# Patient Record
Sex: Male | Born: 1965
Health system: Southern US, Community
[De-identification: ages and names within clinical notes are randomized; demographics above are authoritative.]

## PROBLEM LIST (undated history)

## (undated) DIAGNOSIS — I10 Essential (primary) hypertension: Secondary | ICD-10-CM

## (undated) DIAGNOSIS — E785 Hyperlipidemia, unspecified: Secondary | ICD-10-CM

## (undated) DIAGNOSIS — I119 Hypertensive heart disease without heart failure: Secondary | ICD-10-CM

## (undated) DIAGNOSIS — I43 Cardiomyopathy in diseases classified elsewhere: Secondary | ICD-10-CM

## (undated) DIAGNOSIS — J309 Allergic rhinitis, unspecified: Secondary | ICD-10-CM

## (undated) DIAGNOSIS — G20A1 Parkinson's disease without dyskinesia, without mention of fluctuations: Secondary | ICD-10-CM

## (undated) HISTORY — DX: Allergic rhinitis, unspecified: J30.9

## (undated) HISTORY — DX: Hypertensive heart disease without heart failure: I11.9

## (undated) HISTORY — DX: Essential (primary) hypertension: I10

## (undated) HISTORY — DX: Hyperlipidemia, unspecified: E78.5

## (undated) HISTORY — DX: Hypertensive heart disease without heart failure: I43

## (undated) HISTORY — PX: BACK SURGERY: SHX140

## (undated) HISTORY — DX: Parkinson's disease without dyskinesia, without mention of fluctuations: G20.A1

---

## 2003-06-20 ENCOUNTER — Inpatient Hospital Stay (HOSPITAL_COMMUNITY): Admission: AD | Admit: 2003-06-20 | Discharge: 2003-06-22 | Payer: Self-pay | Admitting: Neurosurgery

## 2003-06-20 ENCOUNTER — Encounter: Payer: Self-pay | Admitting: Neurosurgery

## 2003-12-23 ENCOUNTER — Emergency Department (HOSPITAL_COMMUNITY): Admission: AD | Admit: 2003-12-23 | Discharge: 2003-12-23 | Payer: Self-pay | Admitting: Family Medicine

## 2003-12-29 ENCOUNTER — Inpatient Hospital Stay (HOSPITAL_COMMUNITY): Admission: AD | Admit: 2003-12-29 | Discharge: 2003-12-30 | Payer: Self-pay | Admitting: Neurosurgery

## 2015-12-28 HISTORY — PX: LIPOMA EXCISION: SHX5283

## 2016-03-16 DIAGNOSIS — D171 Benign lipomatous neoplasm of skin and subcutaneous tissue of trunk: Secondary | ICD-10-CM | POA: Insufficient documentation

## 2016-05-06 DIAGNOSIS — D171 Benign lipomatous neoplasm of skin and subcutaneous tissue of trunk: Secondary | ICD-10-CM | POA: Insufficient documentation

## 2016-05-20 DIAGNOSIS — Z1211 Encounter for screening for malignant neoplasm of colon: Secondary | ICD-10-CM | POA: Insufficient documentation

## 2016-12-17 ENCOUNTER — Other Ambulatory Visit: Payer: Self-pay | Admitting: Nurse Practitioner

## 2016-12-17 ENCOUNTER — Ambulatory Visit
Admission: RE | Admit: 2016-12-17 | Discharge: 2016-12-17 | Disposition: A | Discharge status: Home / Self Care | Payer: Self-pay | Source: Ambulatory Visit | Attending: Nurse Practitioner | Admitting: Nurse Practitioner

## 2016-12-17 DIAGNOSIS — M5126 Other intervertebral disc displacement, lumbar region: Secondary | ICD-10-CM

## 2017-01-17 DIAGNOSIS — M5126 Other intervertebral disc displacement, lumbar region: Secondary | ICD-10-CM | POA: Diagnosis not present

## 2017-01-17 DIAGNOSIS — M47816 Spondylosis without myelopathy or radiculopathy, lumbar region: Secondary | ICD-10-CM | POA: Diagnosis not present

## 2017-01-26 ENCOUNTER — Other Ambulatory Visit: Payer: Self-pay | Admitting: Nurse Practitioner

## 2017-01-26 DIAGNOSIS — M5126 Other intervertebral disc displacement, lumbar region: Secondary | ICD-10-CM

## 2017-01-26 DIAGNOSIS — M47816 Spondylosis without myelopathy or radiculopathy, lumbar region: Secondary | ICD-10-CM

## 2017-02-05 ENCOUNTER — Other Ambulatory Visit: Payer: Self-pay | Admitting: Nurse Practitioner

## 2017-02-05 ENCOUNTER — Ambulatory Visit
Admission: RE | Admit: 2017-02-05 | Discharge: 2017-02-05 | Disposition: A | Discharge status: Home / Self Care | Payer: BLUE CROSS/BLUE SHIELD | Source: Ambulatory Visit | Attending: Nurse Practitioner | Admitting: Nurse Practitioner

## 2017-02-05 DIAGNOSIS — M47816 Spondylosis without myelopathy or radiculopathy, lumbar region: Secondary | ICD-10-CM

## 2017-02-05 DIAGNOSIS — M5126 Other intervertebral disc displacement, lumbar region: Secondary | ICD-10-CM

## 2017-02-13 ENCOUNTER — Ambulatory Visit
Admission: RE | Admit: 2017-02-13 | Discharge: 2017-02-13 | Disposition: A | Discharge status: Home / Self Care | Payer: BLUE CROSS/BLUE SHIELD | Source: Ambulatory Visit | Attending: Nurse Practitioner | Admitting: Nurse Practitioner

## 2017-02-13 DIAGNOSIS — M48061 Spinal stenosis, lumbar region without neurogenic claudication: Secondary | ICD-10-CM | POA: Diagnosis not present

## 2017-02-13 DIAGNOSIS — M5126 Other intervertebral disc displacement, lumbar region: Secondary | ICD-10-CM

## 2017-02-13 DIAGNOSIS — M47816 Spondylosis without myelopathy or radiculopathy, lumbar region: Secondary | ICD-10-CM

## 2017-02-17 DIAGNOSIS — M47816 Spondylosis without myelopathy or radiculopathy, lumbar region: Secondary | ICD-10-CM | POA: Diagnosis not present

## 2017-02-17 DIAGNOSIS — M5126 Other intervertebral disc displacement, lumbar region: Secondary | ICD-10-CM | POA: Diagnosis not present

## 2017-02-18 ENCOUNTER — Other Ambulatory Visit: Payer: Self-pay | Admitting: Nurse Practitioner

## 2017-02-18 DIAGNOSIS — M5126 Other intervertebral disc displacement, lumbar region: Secondary | ICD-10-CM

## 2017-03-22 ENCOUNTER — Ambulatory Visit
Admission: RE | Admit: 2017-03-22 | Discharge: 2017-03-22 | Disposition: A | Discharge status: Home / Self Care | Payer: BLUE CROSS/BLUE SHIELD | Source: Ambulatory Visit | Attending: Nurse Practitioner | Admitting: Nurse Practitioner

## 2017-03-22 DIAGNOSIS — M545 Low back pain: Secondary | ICD-10-CM | POA: Diagnosis not present

## 2017-03-22 DIAGNOSIS — M5126 Other intervertebral disc displacement, lumbar region: Secondary | ICD-10-CM

## 2017-03-22 MED ORDER — IOPAMIDOL (ISOVUE-M 200) INJECTION 41%
1.0000 mL | Freq: Once | INTRAMUSCULAR | Status: AC
  Administered 2017-03-22: 1 mL via EPIDURAL

## 2017-03-22 MED ORDER — METHYLPREDNISOLONE ACETATE 40 MG/ML INJ SUSP (RADIOLOG
120.0000 mg | Freq: Once | INTRAMUSCULAR | Status: AC
  Administered 2017-03-22: 120 mg via EPIDURAL

## 2017-03-22 NOTE — Discharge Instructions (Signed)

## 2017-04-14 DIAGNOSIS — R0781 Pleurodynia: Secondary | ICD-10-CM | POA: Diagnosis not present

## 2017-04-14 DIAGNOSIS — M5126 Other intervertebral disc displacement, lumbar region: Secondary | ICD-10-CM | POA: Diagnosis not present

## 2017-04-15 ENCOUNTER — Other Ambulatory Visit: Payer: Self-pay | Admitting: Nurse Practitioner

## 2017-04-15 DIAGNOSIS — R0781 Pleurodynia: Secondary | ICD-10-CM

## 2017-04-26 ENCOUNTER — Ambulatory Visit
Admission: RE | Admit: 2017-04-26 | Discharge: 2017-04-26 | Disposition: A | Discharge status: Home / Self Care | Payer: BLUE CROSS/BLUE SHIELD | Source: Ambulatory Visit | Attending: Nurse Practitioner | Admitting: Nurse Practitioner

## 2017-04-26 ENCOUNTER — Inpatient Hospital Stay
Admission: RE | Admit: 2017-04-26 | Discharge: 2017-04-26 | Disposition: A | Discharge status: Home / Self Care | Payer: BLUE CROSS/BLUE SHIELD | Source: Ambulatory Visit | Attending: Nurse Practitioner | Admitting: Nurse Practitioner

## 2017-04-26 DIAGNOSIS — R0781 Pleurodynia: Secondary | ICD-10-CM

## 2017-04-26 DIAGNOSIS — M47817 Spondylosis without myelopathy or radiculopathy, lumbosacral region: Secondary | ICD-10-CM | POA: Diagnosis not present

## 2017-04-26 MED ORDER — IOPAMIDOL (ISOVUE-M 200) INJECTION 41%
1.0000 mL | Freq: Once | INTRAMUSCULAR | Status: AC
  Administered 2017-04-26: 1 mL via EPIDURAL

## 2017-04-26 MED ORDER — METHYLPREDNISOLONE ACETATE 40 MG/ML INJ SUSP (RADIOLOG
120.0000 mg | Freq: Once | INTRAMUSCULAR | Status: AC
  Administered 2017-04-26: 120 mg via EPIDURAL

## 2017-04-26 NOTE — Discharge Instructions (Signed)

## 2017-04-29 ENCOUNTER — Other Ambulatory Visit: Payer: Self-pay | Admitting: Gastroenterology

## 2017-04-29 DIAGNOSIS — M545 Low back pain, unspecified: Secondary | ICD-10-CM | POA: Insufficient documentation

## 2017-05-02 DIAGNOSIS — R109 Unspecified abdominal pain: Secondary | ICD-10-CM | POA: Diagnosis not present

## 2017-05-02 DIAGNOSIS — M545 Low back pain: Secondary | ICD-10-CM | POA: Diagnosis not present

## 2017-05-26 DIAGNOSIS — M47816 Spondylosis without myelopathy or radiculopathy, lumbar region: Secondary | ICD-10-CM | POA: Diagnosis not present

## 2017-05-26 DIAGNOSIS — M5126 Other intervertebral disc displacement, lumbar region: Secondary | ICD-10-CM | POA: Diagnosis not present

## 2017-05-27 ENCOUNTER — Other Ambulatory Visit: Payer: Self-pay | Admitting: Nurse Practitioner

## 2017-05-27 DIAGNOSIS — M47816 Spondylosis without myelopathy or radiculopathy, lumbar region: Secondary | ICD-10-CM

## 2017-05-30 ENCOUNTER — Ambulatory Visit
Admission: RE | Admit: 2017-05-30 | Discharge: 2017-05-30 | Disposition: A | Discharge status: Home / Self Care | Payer: BLUE CROSS/BLUE SHIELD | Source: Ambulatory Visit | Attending: Nurse Practitioner | Admitting: Nurse Practitioner

## 2017-05-30 DIAGNOSIS — M47816 Spondylosis without myelopathy or radiculopathy, lumbar region: Secondary | ICD-10-CM

## 2017-05-30 DIAGNOSIS — M47817 Spondylosis without myelopathy or radiculopathy, lumbosacral region: Secondary | ICD-10-CM | POA: Diagnosis not present

## 2017-05-30 MED ORDER — METHYLPREDNISOLONE ACETATE 40 MG/ML INJ SUSP (RADIOLOG
120.0000 mg | Freq: Once | INTRAMUSCULAR | Status: AC
  Administered 2017-05-30: 120 mg via EPIDURAL

## 2017-05-30 MED ORDER — IOPAMIDOL (ISOVUE-M 200) INJECTION 41%
1.0000 mL | Freq: Once | INTRAMUSCULAR | Status: AC
  Administered 2017-05-30: 1 mL via EPIDURAL

## 2017-07-19 IMAGING — MR MR LUMBAR SPINE W/O CM
5 series · 35 of 48 positions shown · non-contrast
Comparison: 05/02/2013

CLINICAL DATA: Lumbar spondylosis.

EXAM:
MRI LUMBAR SPINE WITHOUT CONTRAST
TECHNIQUE: Multiplanar, multisequence MR imaging of the lumbar spine was
performed. No intravenous contrast was administered.

[Series 4: T2 · sagittal · 4.0mm · 0.57mm/px · 4 of 12 slices shown (1 of 2)]
[im 1/12]
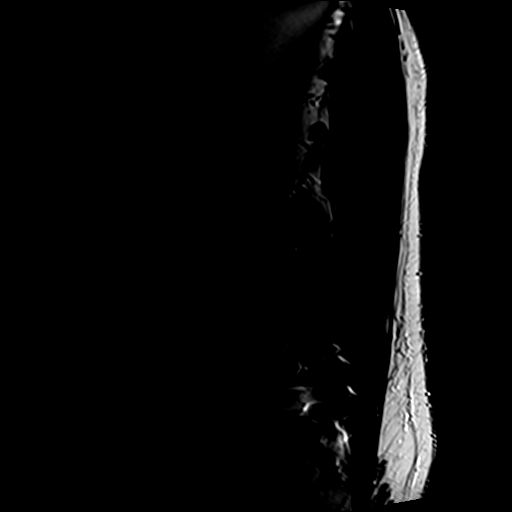
[im 4/12]
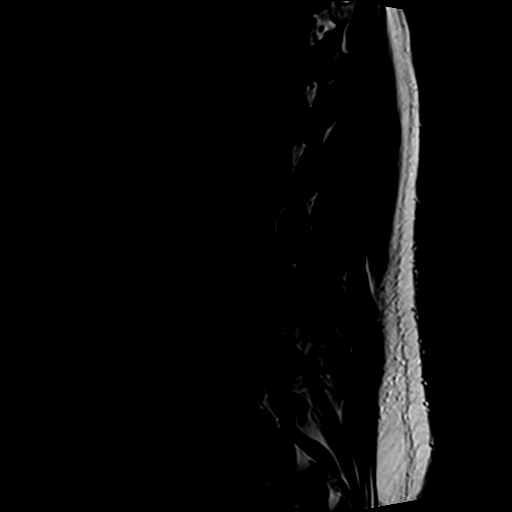
[im 8/12]
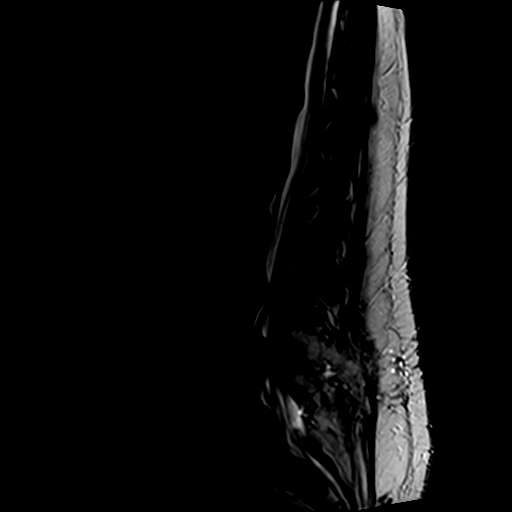
[im 12/12]
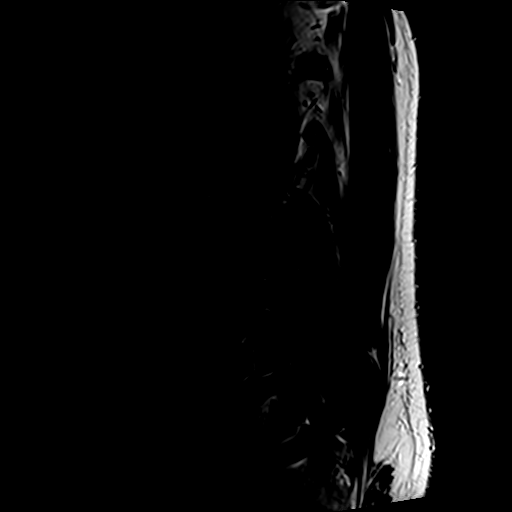

[Series 5: T1 · sagittal · 4.0mm · 0.57mm/px · 5 of 12 slices shown (1 of 2)]
[im 1/12]
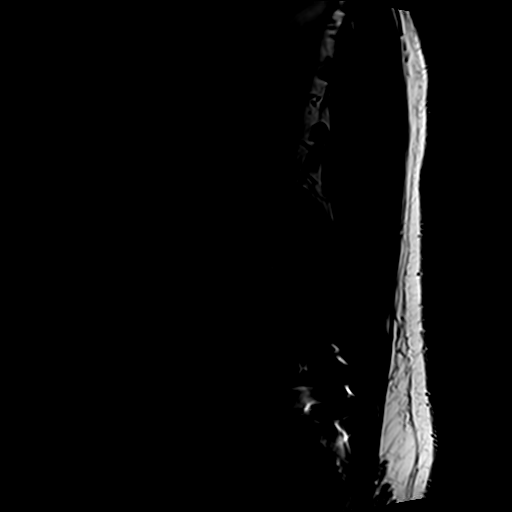
[im 3/12]
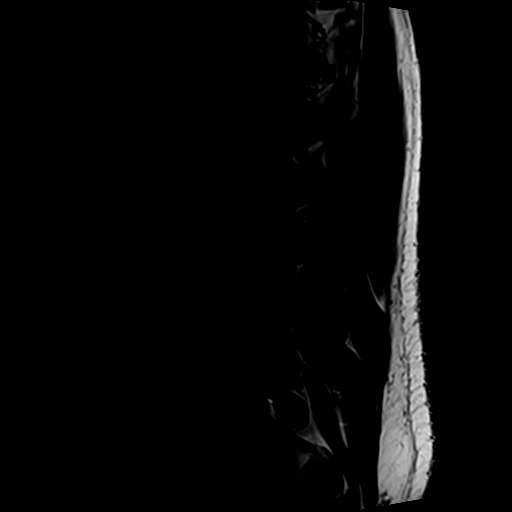
[im 6/12]
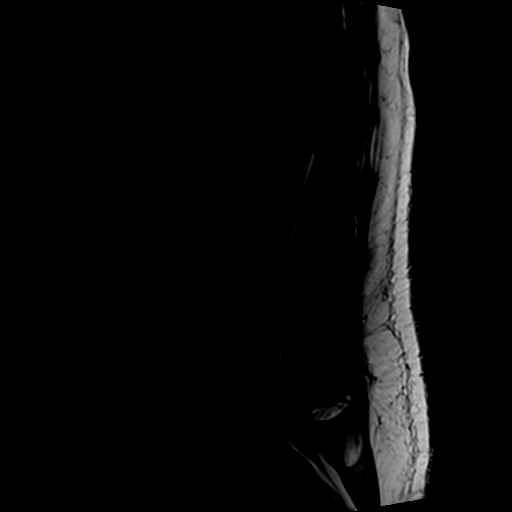
[im 9/12]
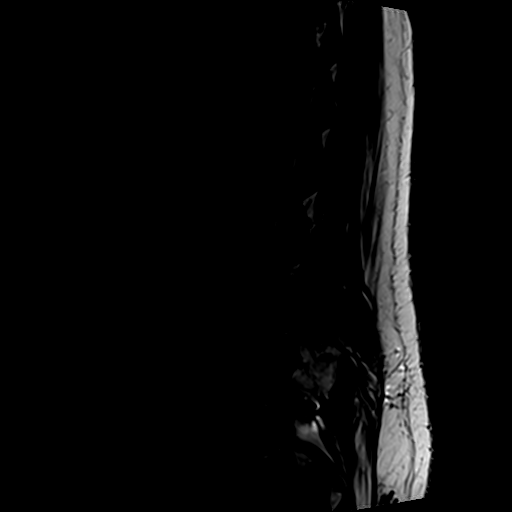
[im 12/12]
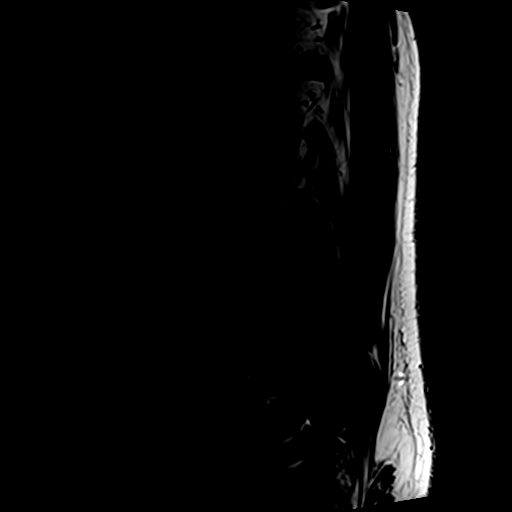

[Series 6: STIR · sagittal · 4.0mm · 0.57mm/px · 4 of 12 slices shown]
[im 1/12]
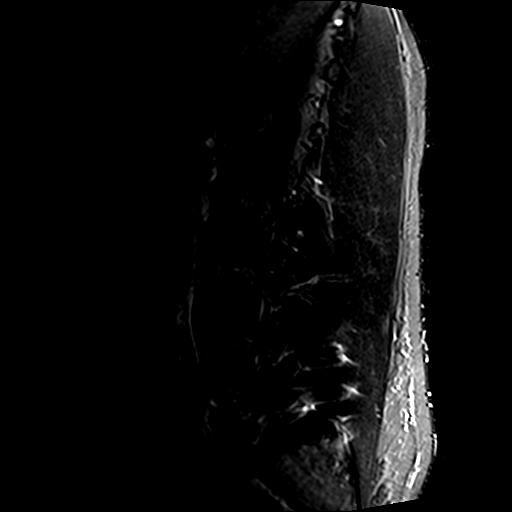
[im 3/12]
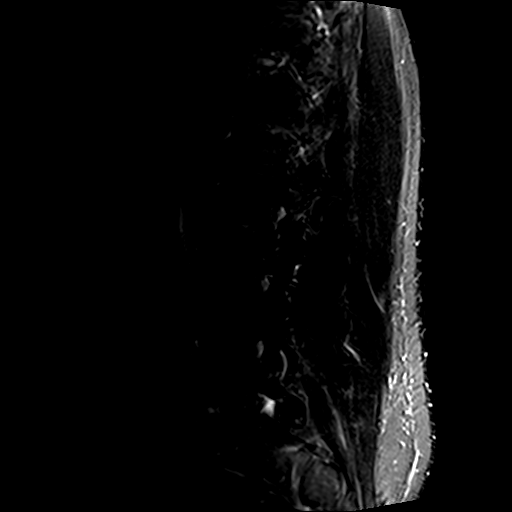
[im 6/12]
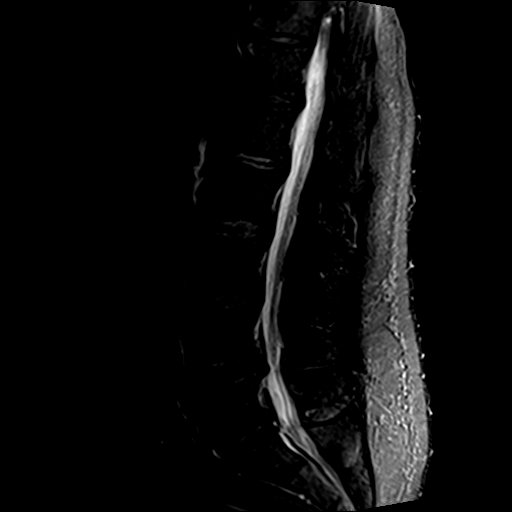
[im 9/12]
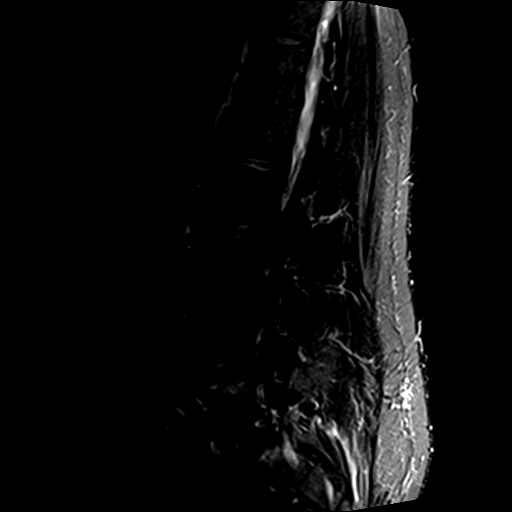

[Series 7: T2 · axial · 4.0mm · 0.74mm/px · z∈[-137,+74]mm · 11 of 42 slices shown (2 of 2)]
[im 3/42]
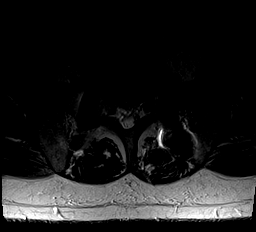
[im 6/42]
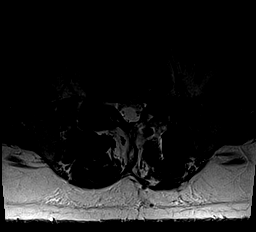
[im 8/42]
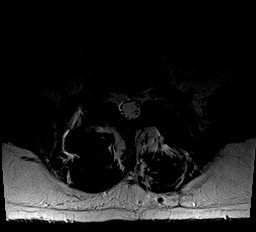
[im 13/42]
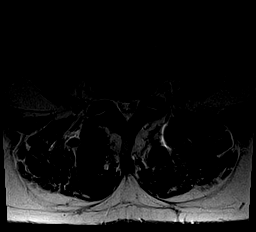
[im 18/42]
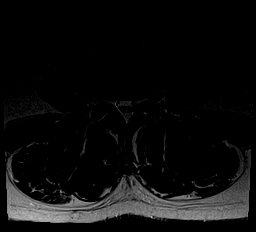
[im 21/42]
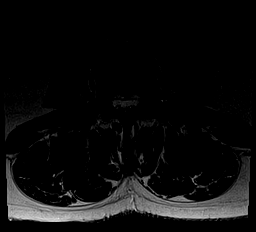
[im 24/42]
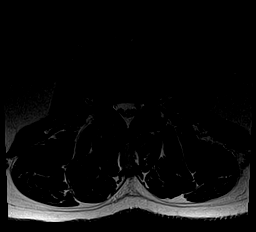
[im 29/42]
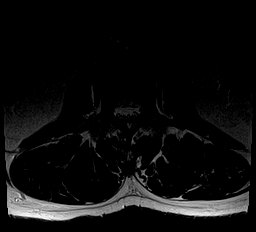
[im 34/42]
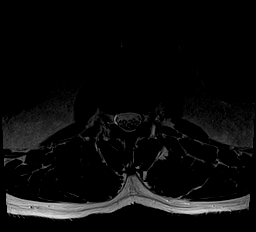
[im 36/42]
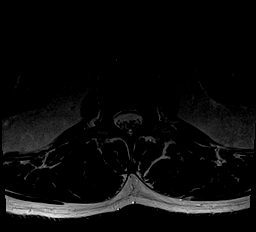
[im 39/42]
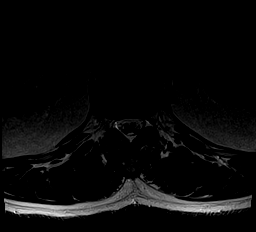

[Series 8: T1 · axial · 4.0mm · 0.74mm/px · z∈[-137,+74]mm · 11 of 41 slices shown (2 of 2)]
[im 3/41]
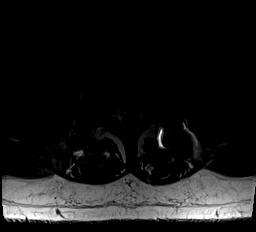
[im 6/41]
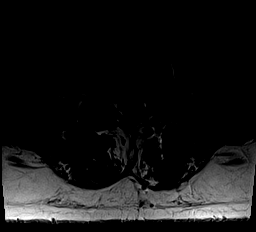
[im 8/41]
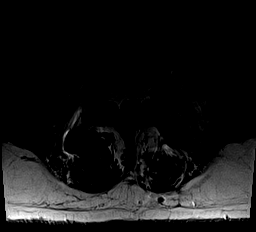
[im 13/41]
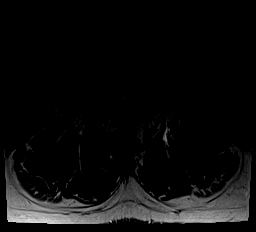
[im 18/41]
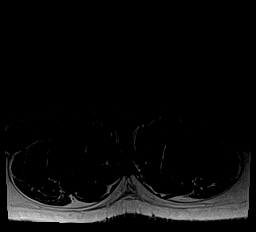
[im 21/41]
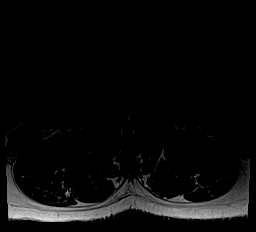
[im 23/41]
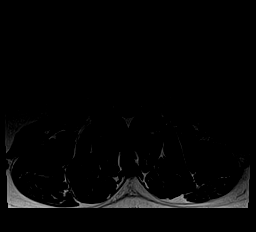
[im 28/41]
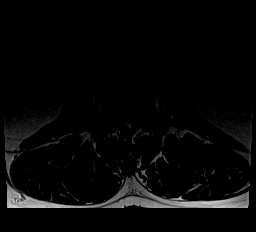
[im 33/41]
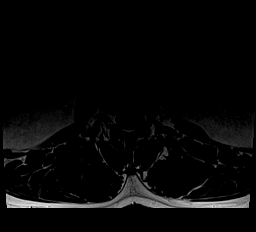
[im 36/41]
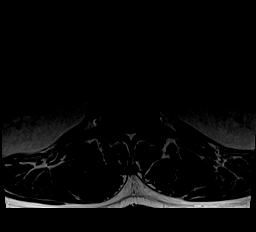
[im 38/41]
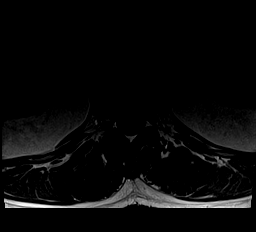

[35 of 48 positions shown; findings below may reference images not displayed]

FINDINGS: Segmentation:  Standard.

Alignment:  Physiologic.

Vertebrae:  No fracture, evidence of discitis, or bone lesion.

Conus medullaris: Extends to the L1 level and appears normal.

Paraspinal and other soft tissues: Negative

Disc levels:

T12- L1: Tiny central disc protrusion with ridge.  No impingement

L1-L2: Unremarkable.

L2-L3: Mild facet spurring.  No herniation or impingement

L3-L4: Mild facet hypertrophy. Mild disc narrowing and minor
bulging. Mild central thecal sac flattening without impingement

L4-L5: Disc narrowing and circumferential bulging with small
superimposed central protrusion. Facet and ligament hypertrophy,
progressed. Progressed spinal stenosis, moderate range. L5 nerves
are posteriorly displaced. Inferior foraminal narrowing without L4
compression

L5-S1:Discectomy with solid bony fusion.  No residual impingement.
IMPRESSION: 1. L4-5 adjacent segment degenerative disease with moderate spinal
stenosis.
2. L5-S1 PLIF with solid bony fusion.

## 2017-08-03 DIAGNOSIS — M5126 Other intervertebral disc displacement, lumbar region: Secondary | ICD-10-CM | POA: Diagnosis not present

## 2017-08-06 DIAGNOSIS — R079 Chest pain, unspecified: Secondary | ICD-10-CM | POA: Diagnosis not present

## 2017-08-06 DIAGNOSIS — M545 Low back pain: Secondary | ICD-10-CM | POA: Diagnosis not present

## 2017-08-06 DIAGNOSIS — R0609 Other forms of dyspnea: Secondary | ICD-10-CM | POA: Diagnosis not present

## 2017-08-06 DIAGNOSIS — G8929 Other chronic pain: Secondary | ICD-10-CM | POA: Diagnosis not present

## 2017-08-06 DIAGNOSIS — R0789 Other chest pain: Secondary | ICD-10-CM | POA: Diagnosis not present

## 2017-08-06 DIAGNOSIS — I517 Cardiomegaly: Secondary | ICD-10-CM | POA: Diagnosis not present

## 2017-08-06 DIAGNOSIS — R0602 Shortness of breath: Secondary | ICD-10-CM | POA: Diagnosis not present

## 2017-08-07 DIAGNOSIS — R0602 Shortness of breath: Secondary | ICD-10-CM | POA: Diagnosis not present

## 2017-08-07 DIAGNOSIS — R079 Chest pain, unspecified: Secondary | ICD-10-CM | POA: Diagnosis not present

## 2017-08-07 DIAGNOSIS — R0609 Other forms of dyspnea: Secondary | ICD-10-CM | POA: Diagnosis not present

## 2017-08-08 DIAGNOSIS — R079 Chest pain, unspecified: Secondary | ICD-10-CM | POA: Diagnosis not present

## 2017-08-08 DIAGNOSIS — R0602 Shortness of breath: Secondary | ICD-10-CM | POA: Diagnosis not present

## 2017-08-10 DIAGNOSIS — I517 Cardiomegaly: Secondary | ICD-10-CM | POA: Diagnosis not present

## 2017-08-10 DIAGNOSIS — R079 Chest pain, unspecified: Secondary | ICD-10-CM | POA: Diagnosis not present

## 2017-08-10 DIAGNOSIS — R400 Somnolence: Secondary | ICD-10-CM | POA: Diagnosis not present

## 2017-08-10 DIAGNOSIS — R0609 Other forms of dyspnea: Secondary | ICD-10-CM | POA: Diagnosis not present

## 2017-09-21 DIAGNOSIS — M545 Low back pain: Secondary | ICD-10-CM | POA: Diagnosis not present

## 2017-09-21 DIAGNOSIS — I10 Essential (primary) hypertension: Secondary | ICD-10-CM | POA: Diagnosis not present

## 2017-09-21 DIAGNOSIS — G8929 Other chronic pain: Secondary | ICD-10-CM | POA: Diagnosis not present

## 2017-11-02 IMAGING — XA Imaging study
1 series · 1 of 1 positions shown · non-contrast
Comparison: none

CLINICAL DATA: Lumbosacral spondylosis without myelopathy. Partial
improvement after the last injection. I repeated it.

[Series 2: ortho standard · 1 of 1 slices shown]
[im 1/1]
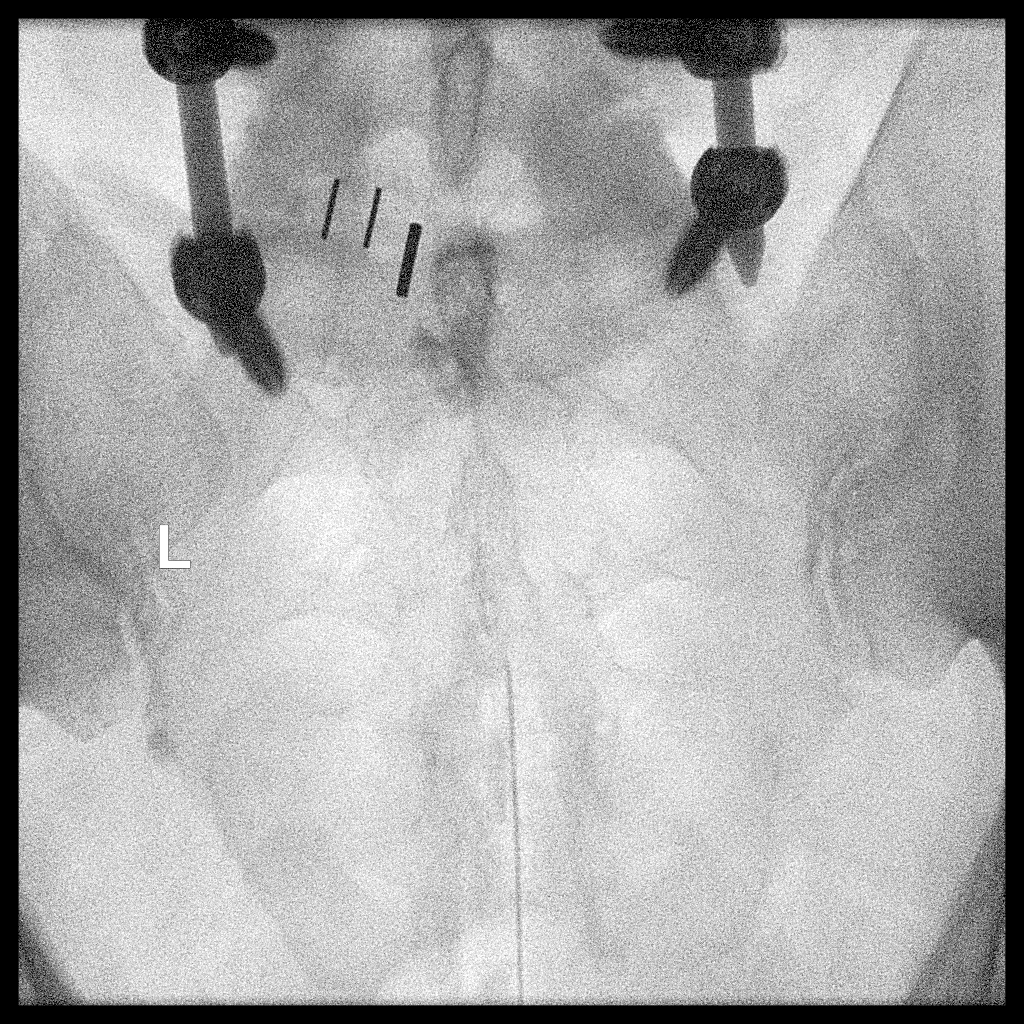

[1 of 1 positions shown; findings below may reference images not displayed]

FLUOROSCOPY TIME:  12 seconds corresponding to a Dose Area Product
of 42.37 ?Gy*m2

EXAM:
CAUDAL EPIDURAL INJECTION

Utilizing a caudal approach, the skin overlying the sacral hiatus
was cleansed and anesthetized. A 20 gauge epidural needle was
advanced into the sacral epidural space. Injection of Isovue-M 200
shows a good epidural pattern with spread up to L5-S1. No vascular
opacification is seen.

120 mg of Depo-Medrol mixed with 3 ml of normal saline and 3 ml of
1% Lidocaine were instilled. The procedure was well-tolerated, and
the patient was discharged thirty minutes following the injection in
good condition.
IMPRESSION: Technically successful caudal epidural injection #3.

## 2017-11-08 DIAGNOSIS — M5126 Other intervertebral disc displacement, lumbar region: Secondary | ICD-10-CM | POA: Diagnosis not present

## 2017-11-08 DIAGNOSIS — Z6834 Body mass index (BMI) 34.0-34.9, adult: Secondary | ICD-10-CM | POA: Diagnosis not present

## 2017-12-27 DIAGNOSIS — I5189 Other ill-defined heart diseases: Secondary | ICD-10-CM

## 2017-12-27 HISTORY — DX: Other ill-defined heart diseases: I51.89

## 2018-02-08 DIAGNOSIS — Z6831 Body mass index (BMI) 31.0-31.9, adult: Secondary | ICD-10-CM | POA: Diagnosis not present

## 2018-02-08 DIAGNOSIS — M5126 Other intervertebral disc displacement, lumbar region: Secondary | ICD-10-CM | POA: Diagnosis not present

## 2018-04-28 DIAGNOSIS — Z6830 Body mass index (BMI) 30.0-30.9, adult: Secondary | ICD-10-CM | POA: Diagnosis not present

## 2018-04-28 DIAGNOSIS — M5126 Other intervertebral disc displacement, lumbar region: Secondary | ICD-10-CM | POA: Diagnosis not present

## 2018-08-04 DIAGNOSIS — M5126 Other intervertebral disc displacement, lumbar region: Secondary | ICD-10-CM | POA: Diagnosis not present

## 2018-08-04 DIAGNOSIS — Z6831 Body mass index (BMI) 31.0-31.9, adult: Secondary | ICD-10-CM | POA: Diagnosis not present

## 2018-08-13 DIAGNOSIS — R55 Syncope and collapse: Secondary | ICD-10-CM | POA: Diagnosis not present

## 2018-08-13 DIAGNOSIS — R61 Generalized hyperhidrosis: Secondary | ICD-10-CM | POA: Diagnosis not present

## 2018-08-13 DIAGNOSIS — E861 Hypovolemia: Secondary | ICD-10-CM | POA: Insufficient documentation

## 2018-08-13 DIAGNOSIS — R9431 Abnormal electrocardiogram [ECG] [EKG]: Secondary | ICD-10-CM | POA: Diagnosis not present

## 2018-08-13 DIAGNOSIS — R001 Bradycardia, unspecified: Secondary | ICD-10-CM | POA: Diagnosis not present

## 2018-08-13 DIAGNOSIS — Z87891 Personal history of nicotine dependence: Secondary | ICD-10-CM | POA: Diagnosis not present

## 2018-08-13 DIAGNOSIS — I9589 Other hypotension: Secondary | ICD-10-CM

## 2018-08-13 DIAGNOSIS — R0902 Hypoxemia: Secondary | ICD-10-CM | POA: Diagnosis not present

## 2018-08-13 DIAGNOSIS — I959 Hypotension, unspecified: Secondary | ICD-10-CM | POA: Diagnosis not present

## 2018-08-14 DIAGNOSIS — R55 Syncope and collapse: Secondary | ICD-10-CM | POA: Diagnosis not present

## 2018-08-14 DIAGNOSIS — I959 Hypotension, unspecified: Secondary | ICD-10-CM | POA: Diagnosis not present

## 2018-08-14 DIAGNOSIS — E861 Hypovolemia: Secondary | ICD-10-CM | POA: Diagnosis not present

## 2018-08-16 DIAGNOSIS — I5030 Unspecified diastolic (congestive) heart failure: Secondary | ICD-10-CM | POA: Diagnosis not present

## 2018-08-16 DIAGNOSIS — R079 Chest pain, unspecified: Secondary | ICD-10-CM | POA: Diagnosis not present

## 2018-08-16 DIAGNOSIS — R55 Syncope and collapse: Secondary | ICD-10-CM | POA: Diagnosis not present

## 2018-08-16 DIAGNOSIS — I10 Essential (primary) hypertension: Secondary | ICD-10-CM | POA: Diagnosis not present

## 2018-08-16 DIAGNOSIS — I517 Cardiomegaly: Secondary | ICD-10-CM | POA: Diagnosis not present

## 2018-08-16 DIAGNOSIS — R0602 Shortness of breath: Secondary | ICD-10-CM | POA: Diagnosis not present

## 2018-08-16 DIAGNOSIS — I252 Old myocardial infarction: Secondary | ICD-10-CM | POA: Diagnosis not present

## 2018-08-16 DIAGNOSIS — R0609 Other forms of dyspnea: Secondary | ICD-10-CM | POA: Diagnosis not present

## 2018-08-16 DIAGNOSIS — Z79899 Other long term (current) drug therapy: Secondary | ICD-10-CM | POA: Diagnosis not present

## 2018-08-16 DIAGNOSIS — R42 Dizziness and giddiness: Secondary | ICD-10-CM | POA: Diagnosis not present

## 2018-08-17 DIAGNOSIS — I5030 Unspecified diastolic (congestive) heart failure: Secondary | ICD-10-CM

## 2018-08-17 DIAGNOSIS — I10 Essential (primary) hypertension: Secondary | ICD-10-CM | POA: Diagnosis not present

## 2018-08-17 DIAGNOSIS — R079 Chest pain, unspecified: Secondary | ICD-10-CM | POA: Diagnosis not present

## 2018-08-17 DIAGNOSIS — R55 Syncope and collapse: Secondary | ICD-10-CM | POA: Diagnosis not present

## 2018-08-17 DIAGNOSIS — I517 Cardiomegaly: Secondary | ICD-10-CM | POA: Diagnosis not present

## 2018-08-18 DIAGNOSIS — R55 Syncope and collapse: Secondary | ICD-10-CM | POA: Diagnosis not present

## 2018-08-18 DIAGNOSIS — I517 Cardiomegaly: Secondary | ICD-10-CM | POA: Diagnosis not present

## 2018-08-18 DIAGNOSIS — I10 Essential (primary) hypertension: Secondary | ICD-10-CM | POA: Diagnosis not present

## 2018-08-18 DIAGNOSIS — R079 Chest pain, unspecified: Secondary | ICD-10-CM | POA: Diagnosis not present

## 2018-08-24 ENCOUNTER — Encounter: Payer: Self-pay | Admitting: Cardiology

## 2018-08-24 ENCOUNTER — Ambulatory Visit: Payer: BLUE CROSS/BLUE SHIELD | Admitting: Cardiology

## 2018-08-24 VITALS — BP 134/86 | HR 70 | Ht 75.0 in | Wt 254.2 lb

## 2018-08-24 DIAGNOSIS — R55 Syncope and collapse: Secondary | ICD-10-CM

## 2018-08-24 NOTE — Progress Notes (Signed)
Cardiology Office Note:    Date:  08/24/2018   ID:  Stuart Lucero, DOB [birth year], MRN [id number]  PCP:  Stuart Lucero., MD  Cardiologist:  Jenean Lindau, MD   Referring MD: Stuart Lucero., MD    ASSESSMENT:    1. Syncope, unspecified syncope type    PLAN:    In order of problems listed above:  1. Primary prevention stressed with the patient.  Importance of compliance with diet and medication stressed and he vocalized understanding. 2. He has been initiated on atorvastatin and his muscle aches at this point asked him to hold his medications. 3. He says that since lisinopril was initiated he feels tired and dizzy at times so have cut down his dose to 5 mg daily.  He tells me that his blood pressure was elevated even before initiating the medication.  He also has pain issues with his lower back which contributes to his blood pressure. 4. He will go one month event monitor to understand if any of these are related to any arrhythmias in his heart. 5. I asked him to get established with a primary care physician as soon as possible and consider neurology evaluation. 6. Patient will be seen in follow-up appointment in 6 months or earlier if the patient has any concerns 7. He knows to go to the nearest emergency room for any concerning symptoms.  In view of his symptoms I told him not to drive or ride till cleared by his primary care doctor.   Medication Adjustments/Labs and Tests Ordered: Current medicines are reviewed at length with the patient today.  Concerns regarding medicines are outlined above.  Orders Placed This Encounter  Procedures  . CARDIAC EVENT MONITOR   No orders of the defined types were placed in this encounter.    History of Present Illness:    Stuart Lucero is a 52 y.o. male who is being seen today for the evaluation of syncope and dyslipidemia at the request of Stuart Lucero., MD.  Patient is a pleasant 51 year old male.  He has past medical history  of dyslipidemia.  He has had 2 episodes of syncope.  Patient mentions to me that he does not feel good he gets in.  Since he has profuse sweating.  He says that after his syncope he had some muscle twitching and such problems.  For this reason he is going to hospitals one in [place], [place] and the other is at a local hospital.  His evaluation was unremarkable.  Echocardiogram was unremarkable and stress test was fine.  He was sent here for evaluation for possible event monitoring.  At the time of my evaluation, the patient is alert awake oriented and in no distress.  He is accompanied by his wife for this visit.  Past Medical History:  Diagnosis Date  . Hypertension     Past Surgical History:  Procedure Laterality Date  . LIPOMA EXCISION  2017    Current Medications: Current Meds  Medication Sig  . HYDROcodone-acetaminophen (NORCO/VICODIN) 5-325 MG tablet Take 1 tablet by mouth at bedtime.   Marland Kitchen lisinopril (PRINIVIL,ZESTRIL) 10 MG tablet Take 10 mg by mouth daily.  . [DISCONTINUED] atorvastatin (LIPITOR) 40 MG tablet Take 40 mg by mouth daily.  . [DISCONTINUED] HYDROcodone-acetaminophen (NORCO/VICODIN) 5-325 MG tablet Take 1 tablet by mouth at bedtime.     Allergies:   Oxycodone-acetaminophen and Gabapentin   Social History   Socioeconomic History  . Marital status: Married    Spouse name:  Not on file  . Number of children: Not on file  . Years of education: Not on file  . Highest education level: Not on file  Occupational History  . Not on file  Social Needs  . Financial resource strain: Not on file  . Food insecurity:    Worry: Not on file    Inability: Not on file  . Transportation needs:    Medical: Not on file    Non-medical: Not on file  Tobacco Use  . Smoking status: Former Smoker    Packs/day: 0.50    Years: 5.00    Pack years: 2.50    Types: Cigarettes    Last attempt to quit: 12/27/2017    Years since quitting: 0.6  . Smokeless tobacco: Never Used    Substance and Sexual Activity  . Alcohol use: Yes    Comment: Occasional beer drinker- 2 beers  . Drug use: Never  . Sexual activity: Not on file  Lifestyle  . Physical activity:    Days per week: Not on file    Minutes per session: Not on file  . Stress: Not on file  Relationships  . Social connections:    Talks on phone: Not on file    Gets together: Not on file    Attends religious service: Not on file    Active member of club or organization: Not on file    Attends meetings of clubs or organizations: Not on file    Relationship status: Not on file  Other Topics Concern  . Not on file  Social History Narrative  . Not on file     Family History: The patient's family history includes Heart disease in his father.  ROS:   Please see the history of present illness.    All other systems reviewed and are negative.  EKGs/Labs/Other Studies Reviewed:    The following studies were reviewed today: I reviewed records from hospitals especially [facility] and discussed this at length with him.   Recent Labs: No results found for requested labs within last 8760 hours.  Recent Lipid Panel No results found for: CHOL, TRIG, HDL, CHOLHDL, VLDL, LDLCALC, LDLDIRECT  Physical Exam:    VS:  BP 134/86 (BP Location: Right Arm, Patient Position: Sitting)   Pulse 70   Ht 6\' 3"  (1.905 m)   Wt 254 lb 3.2 oz (115.3 kg)   SpO2 98%   BMI 31.77 kg/m     Wt Readings from Last 3 Encounters:  08/24/18 254 lb 3.2 oz (115.3 kg)     GEN: Patient is in no acute distress HEENT: Normal NECK: No JVD; No carotid bruits LYMPHATICS: No lymphadenopathy CARDIAC: S1 S2 regular, 2/6 systolic murmur at the apex. RESPIRATORY:  Clear to auscultation without rales, wheezing or rhonchi  ABDOMEN: Soft, non-tender, non-distended MUSCULOSKELETAL:  No edema; No deformity  SKIN: Warm and dry NEUROLOGIC:  Alert and oriented x 3 PSYCHIATRIC:  Normal affect    Signed, Jenean Lindau, MD   08/24/2018 3:01 PM    [facility]

## 2018-08-24 NOTE — Patient Instructions (Addendum)
Medication Instructions:  Your physician has recommended you make the following change in your medication:  STOP lipitor CHANGE 5 mg daily of lisinopril  Labwork: None  Testing/Procedures: Your physician has recommended that you wear an event monitor. Event monitors are medical devices that record the heart's electrical activity. Doctors most often [place] these monitors to diagnose arrhythmias. Arrhythmias are problems with the speed or rhythm of the heartbeat. The monitor is a small, portable device. You can wear one while you do your normal daily activities. This is usually used to diagnose what is causing palpitations/syncope (passing out).  Follow-Up: Your physician recommends that you schedule a follow-up appointment in: 6 months  Any Other Special Instructions Will Be Listed Below (If Applicable).     If you need a refill on your cardiac medications before your next appointment, please call your pharmacy.   [place], RN, BSN

## 2018-09-08 ENCOUNTER — Ambulatory Visit (INDEPENDENT_AMBULATORY_CARE_PROVIDER_SITE_OTHER): Payer: BLUE CROSS/BLUE SHIELD

## 2018-09-08 DIAGNOSIS — R55 Syncope and collapse: Secondary | ICD-10-CM

## 2018-09-21 ENCOUNTER — Ambulatory Visit: Payer: BLUE CROSS/BLUE SHIELD | Admitting: Cardiology

## 2018-09-25 ENCOUNTER — Other Ambulatory Visit: Payer: Self-pay

## 2018-09-25 ENCOUNTER — Telehealth: Payer: Self-pay | Admitting: Cardiology

## 2018-09-25 MED ORDER — LISINOPRIL 10 MG PO TABS: 5.0000 mg | ORAL_TABLET | Freq: Every day | ORAL | 2 refills | Status: DC

## 2018-09-25 NOTE — Telephone Encounter (Signed)
Med refill has been sent. 

## 2018-09-25 NOTE — Telephone Encounter (Signed)
°*  STAT* If patient is at the pharmacy, call can be transferred to refill team.   1. Which medications need to be refilled? (please list name of each medication and dose if known)Lisinopril 10mg  takes 1 daily   2. Which pharmacy/location (including street and city if local pharmacy) is medication to be sent to [facility] in [place]  3. Do they need a 30 day or 90 day supply? [place]

## 2018-10-03 DIAGNOSIS — I10 Essential (primary) hypertension: Secondary | ICD-10-CM | POA: Diagnosis not present

## 2018-10-03 DIAGNOSIS — E785 Hyperlipidemia, unspecified: Secondary | ICD-10-CM | POA: Diagnosis not present

## 2018-10-03 DIAGNOSIS — I119 Hypertensive heart disease without heart failure: Secondary | ICD-10-CM | POA: Diagnosis not present

## 2018-10-03 DIAGNOSIS — I43 Cardiomyopathy in diseases classified elsewhere: Secondary | ICD-10-CM | POA: Diagnosis not present

## 2018-10-19 DIAGNOSIS — I10 Essential (primary) hypertension: Secondary | ICD-10-CM | POA: Diagnosis not present

## 2018-10-19 DIAGNOSIS — I43 Cardiomyopathy in diseases classified elsewhere: Secondary | ICD-10-CM | POA: Diagnosis not present

## 2018-10-19 DIAGNOSIS — I119 Hypertensive heart disease without heart failure: Secondary | ICD-10-CM | POA: Diagnosis not present

## 2018-10-19 DIAGNOSIS — Z6832 Body mass index (BMI) 32.0-32.9, adult: Secondary | ICD-10-CM | POA: Diagnosis not present

## 2018-11-13 DIAGNOSIS — H6691 Otitis media, unspecified, right ear: Secondary | ICD-10-CM | POA: Diagnosis not present

## 2018-11-17 DIAGNOSIS — Z6833 Body mass index (BMI) 33.0-33.9, adult: Secondary | ICD-10-CM | POA: Diagnosis not present

## 2018-11-17 DIAGNOSIS — M5126 Other intervertebral disc displacement, lumbar region: Secondary | ICD-10-CM | POA: Diagnosis not present

## 2018-12-14 DIAGNOSIS — Z6833 Body mass index (BMI) 33.0-33.9, adult: Secondary | ICD-10-CM | POA: Diagnosis not present

## 2018-12-14 DIAGNOSIS — Z0000 Encounter for general adult medical examination without abnormal findings: Secondary | ICD-10-CM | POA: Diagnosis not present

## 2018-12-14 DIAGNOSIS — E669 Obesity, unspecified: Secondary | ICD-10-CM | POA: Diagnosis not present

## 2019-01-04 DIAGNOSIS — R55 Syncope and collapse: Secondary | ICD-10-CM | POA: Diagnosis not present

## 2019-03-12 DIAGNOSIS — M5126 Other intervertebral disc displacement, lumbar region: Secondary | ICD-10-CM | POA: Diagnosis not present

## 2019-05-24 DIAGNOSIS — R05 Cough: Secondary | ICD-10-CM | POA: Diagnosis not present

## 2019-05-24 DIAGNOSIS — Z20828 Contact with and (suspected) exposure to other viral communicable diseases: Secondary | ICD-10-CM | POA: Diagnosis not present

## 2019-07-02 DIAGNOSIS — G894 Chronic pain syndrome: Secondary | ICD-10-CM | POA: Diagnosis not present

## 2019-07-02 DIAGNOSIS — M545 Low back pain: Secondary | ICD-10-CM | POA: Diagnosis not present

## 2019-07-12 DIAGNOSIS — M545 Low back pain: Secondary | ICD-10-CM | POA: Diagnosis not present

## 2019-08-02 DIAGNOSIS — M47816 Spondylosis without myelopathy or radiculopathy, lumbar region: Secondary | ICD-10-CM | POA: Diagnosis not present

## 2019-08-02 DIAGNOSIS — Z1389 Encounter for screening for other disorder: Secondary | ICD-10-CM | POA: Diagnosis not present

## 2019-08-02 DIAGNOSIS — G894 Chronic pain syndrome: Secondary | ICD-10-CM | POA: Diagnosis not present

## 2019-08-02 DIAGNOSIS — M5416 Radiculopathy, lumbar region: Secondary | ICD-10-CM | POA: Diagnosis not present

## 2019-09-17 DIAGNOSIS — M47816 Spondylosis without myelopathy or radiculopathy, lumbar region: Secondary | ICD-10-CM | POA: Diagnosis not present

## 2019-09-17 DIAGNOSIS — M5416 Radiculopathy, lumbar region: Secondary | ICD-10-CM | POA: Diagnosis not present

## 2019-09-17 DIAGNOSIS — G894 Chronic pain syndrome: Secondary | ICD-10-CM | POA: Diagnosis not present

## 2019-11-09 DIAGNOSIS — Z6833 Body mass index (BMI) 33.0-33.9, adult: Secondary | ICD-10-CM | POA: Diagnosis not present

## 2019-11-09 DIAGNOSIS — J40 Bronchitis, not specified as acute or chronic: Secondary | ICD-10-CM | POA: Diagnosis not present

## 2019-11-09 DIAGNOSIS — J329 Chronic sinusitis, unspecified: Secondary | ICD-10-CM | POA: Diagnosis not present

## 2019-12-11 DIAGNOSIS — R0982 Postnasal drip: Secondary | ICD-10-CM | POA: Diagnosis not present

## 2019-12-11 DIAGNOSIS — R0789 Other chest pain: Secondary | ICD-10-CM | POA: Diagnosis not present

## 2019-12-11 DIAGNOSIS — J209 Acute bronchitis, unspecified: Secondary | ICD-10-CM | POA: Diagnosis not present

## 2019-12-11 DIAGNOSIS — J309 Allergic rhinitis, unspecified: Secondary | ICD-10-CM | POA: Diagnosis not present

## 2020-01-02 DIAGNOSIS — J309 Allergic rhinitis, unspecified: Secondary | ICD-10-CM | POA: Diagnosis not present

## 2020-01-02 DIAGNOSIS — R05 Cough: Secondary | ICD-10-CM | POA: Diagnosis not present

## 2020-01-02 DIAGNOSIS — J45909 Unspecified asthma, uncomplicated: Secondary | ICD-10-CM | POA: Diagnosis not present

## 2020-01-02 DIAGNOSIS — K219 Gastro-esophageal reflux disease without esophagitis: Secondary | ICD-10-CM | POA: Diagnosis not present

## 2020-03-11 DIAGNOSIS — M47816 Spondylosis without myelopathy or radiculopathy, lumbar region: Secondary | ICD-10-CM | POA: Diagnosis not present

## 2020-03-11 DIAGNOSIS — M545 Low back pain: Secondary | ICD-10-CM | POA: Diagnosis not present

## 2020-03-11 DIAGNOSIS — Z6832 Body mass index (BMI) 32.0-32.9, adult: Secondary | ICD-10-CM | POA: Diagnosis not present

## 2020-03-11 DIAGNOSIS — M62830 Muscle spasm of back: Secondary | ICD-10-CM | POA: Diagnosis not present

## 2020-06-11 DIAGNOSIS — Z20828 Contact with and (suspected) exposure to other viral communicable diseases: Secondary | ICD-10-CM | POA: Diagnosis not present

## 2020-06-11 DIAGNOSIS — R509 Fever, unspecified: Secondary | ICD-10-CM | POA: Diagnosis not present

## 2020-06-16 DIAGNOSIS — Z87891 Personal history of nicotine dependence: Secondary | ICD-10-CM | POA: Diagnosis not present

## 2020-06-16 DIAGNOSIS — I252 Old myocardial infarction: Secondary | ICD-10-CM | POA: Diagnosis not present

## 2020-06-16 DIAGNOSIS — R55 Syncope and collapse: Secondary | ICD-10-CM | POA: Diagnosis not present

## 2020-06-16 DIAGNOSIS — R42 Dizziness and giddiness: Secondary | ICD-10-CM | POA: Diagnosis not present

## 2020-06-16 DIAGNOSIS — E860 Dehydration: Secondary | ICD-10-CM | POA: Diagnosis not present

## 2020-06-16 DIAGNOSIS — R5383 Other fatigue: Secondary | ICD-10-CM | POA: Diagnosis not present

## 2020-06-16 DIAGNOSIS — I10 Essential (primary) hypertension: Secondary | ICD-10-CM | POA: Diagnosis not present

## 2020-06-16 DIAGNOSIS — U071 COVID-19: Secondary | ICD-10-CM | POA: Diagnosis not present

## 2020-10-30 DIAGNOSIS — Z13220 Encounter for screening for lipoid disorders: Secondary | ICD-10-CM | POA: Diagnosis not present

## 2020-10-30 DIAGNOSIS — Z113 Encounter for screening for infections with a predominantly sexual mode of transmission: Secondary | ICD-10-CM | POA: Diagnosis not present

## 2020-10-30 DIAGNOSIS — Z131 Encounter for screening for diabetes mellitus: Secondary | ICD-10-CM | POA: Diagnosis not present

## 2020-10-30 DIAGNOSIS — Z0000 Encounter for general adult medical examination without abnormal findings: Secondary | ICD-10-CM | POA: Diagnosis not present

## 2020-10-30 DIAGNOSIS — Z125 Encounter for screening for malignant neoplasm of prostate: Secondary | ICD-10-CM | POA: Diagnosis not present

## 2020-12-02 DIAGNOSIS — Z20828 Contact with and (suspected) exposure to other viral communicable diseases: Secondary | ICD-10-CM | POA: Diagnosis not present

## 2020-12-02 DIAGNOSIS — J329 Chronic sinusitis, unspecified: Secondary | ICD-10-CM | POA: Diagnosis not present

## 2020-12-02 DIAGNOSIS — J40 Bronchitis, not specified as acute or chronic: Secondary | ICD-10-CM | POA: Diagnosis not present

## 2020-12-05 DIAGNOSIS — J101 Influenza due to other identified influenza virus with other respiratory manifestations: Secondary | ICD-10-CM | POA: Diagnosis not present

## 2020-12-05 DIAGNOSIS — J329 Chronic sinusitis, unspecified: Secondary | ICD-10-CM | POA: Diagnosis not present

## 2020-12-05 DIAGNOSIS — J40 Bronchitis, not specified as acute or chronic: Secondary | ICD-10-CM | POA: Diagnosis not present

## 2021-01-28 DIAGNOSIS — I119 Hypertensive heart disease without heart failure: Secondary | ICD-10-CM | POA: Diagnosis not present

## 2021-01-28 DIAGNOSIS — I10 Essential (primary) hypertension: Secondary | ICD-10-CM | POA: Diagnosis not present

## 2021-01-28 DIAGNOSIS — I43 Cardiomyopathy in diseases classified elsewhere: Secondary | ICD-10-CM | POA: Diagnosis not present

## 2021-01-28 DIAGNOSIS — E669 Obesity, unspecified: Secondary | ICD-10-CM | POA: Diagnosis not present

## 2021-02-25 DIAGNOSIS — R0989 Other specified symptoms and signs involving the circulatory and respiratory systems: Secondary | ICD-10-CM | POA: Diagnosis not present

## 2021-02-25 DIAGNOSIS — I43 Cardiomyopathy in diseases classified elsewhere: Secondary | ICD-10-CM | POA: Diagnosis not present

## 2021-02-25 DIAGNOSIS — I119 Hypertensive heart disease without heart failure: Secondary | ICD-10-CM | POA: Diagnosis not present

## 2021-02-25 DIAGNOSIS — I10 Essential (primary) hypertension: Secondary | ICD-10-CM | POA: Diagnosis not present

## 2021-04-02 DIAGNOSIS — R0989 Other specified symptoms and signs involving the circulatory and respiratory systems: Secondary | ICD-10-CM | POA: Diagnosis not present

## 2021-04-02 DIAGNOSIS — D487 Neoplasm of uncertain behavior of other specified sites: Secondary | ICD-10-CM | POA: Diagnosis not present

## 2021-04-02 DIAGNOSIS — D179 Benign lipomatous neoplasm, unspecified: Secondary | ICD-10-CM | POA: Diagnosis not present

## 2021-04-02 DIAGNOSIS — I10 Essential (primary) hypertension: Secondary | ICD-10-CM | POA: Diagnosis not present

## 2021-05-07 DIAGNOSIS — L719 Rosacea, unspecified: Secondary | ICD-10-CM | POA: Diagnosis not present

## 2021-05-07 DIAGNOSIS — L820 Inflamed seborrheic keratosis: Secondary | ICD-10-CM | POA: Diagnosis not present

## 2021-06-19 DIAGNOSIS — R3989 Other symptoms and signs involving the genitourinary system: Secondary | ICD-10-CM | POA: Diagnosis not present

## 2021-06-19 DIAGNOSIS — Z6832 Body mass index (BMI) 32.0-32.9, adult: Secondary | ICD-10-CM | POA: Diagnosis not present

## 2021-06-23 DIAGNOSIS — N3289 Other specified disorders of bladder: Secondary | ICD-10-CM | POA: Diagnosis not present

## 2021-06-23 DIAGNOSIS — N3943 Post-void dribbling: Secondary | ICD-10-CM | POA: Diagnosis not present

## 2021-06-23 DIAGNOSIS — R3989 Other symptoms and signs involving the genitourinary system: Secondary | ICD-10-CM | POA: Diagnosis not present

## 2021-08-03 DIAGNOSIS — Z1212 Encounter for screening for malignant neoplasm of rectum: Secondary | ICD-10-CM | POA: Diagnosis not present

## 2021-08-03 DIAGNOSIS — Z1211 Encounter for screening for malignant neoplasm of colon: Secondary | ICD-10-CM | POA: Diagnosis not present

## 2021-11-05 DIAGNOSIS — Z20828 Contact with and (suspected) exposure to other viral communicable diseases: Secondary | ICD-10-CM | POA: Diagnosis not present

## 2021-11-05 DIAGNOSIS — J069 Acute upper respiratory infection, unspecified: Secondary | ICD-10-CM | POA: Diagnosis not present

## 2021-12-10 DIAGNOSIS — Z20828 Contact with and (suspected) exposure to other viral communicable diseases: Secondary | ICD-10-CM | POA: Diagnosis not present

## 2021-12-10 DIAGNOSIS — M791 Myalgia, unspecified site: Secondary | ICD-10-CM | POA: Diagnosis not present

## 2021-12-10 DIAGNOSIS — Z6833 Body mass index (BMI) 33.0-33.9, adult: Secondary | ICD-10-CM | POA: Diagnosis not present

## 2022-01-21 DIAGNOSIS — E669 Obesity, unspecified: Secondary | ICD-10-CM | POA: Diagnosis not present

## 2022-01-21 DIAGNOSIS — Z131 Encounter for screening for diabetes mellitus: Secondary | ICD-10-CM | POA: Diagnosis not present

## 2022-01-21 DIAGNOSIS — Z0000 Encounter for general adult medical examination without abnormal findings: Secondary | ICD-10-CM | POA: Diagnosis not present

## 2022-01-21 DIAGNOSIS — R300 Dysuria: Secondary | ICD-10-CM | POA: Diagnosis not present

## 2022-01-21 DIAGNOSIS — E785 Hyperlipidemia, unspecified: Secondary | ICD-10-CM | POA: Diagnosis not present

## 2022-01-21 DIAGNOSIS — Z125 Encounter for screening for malignant neoplasm of prostate: Secondary | ICD-10-CM | POA: Diagnosis not present

## 2022-01-29 DIAGNOSIS — R350 Frequency of micturition: Secondary | ICD-10-CM | POA: Diagnosis not present

## 2022-01-29 DIAGNOSIS — R351 Nocturia: Secondary | ICD-10-CM | POA: Diagnosis not present

## 2022-01-29 DIAGNOSIS — N401 Enlarged prostate with lower urinary tract symptoms: Secondary | ICD-10-CM | POA: Diagnosis not present

## 2022-03-10 DIAGNOSIS — R351 Nocturia: Secondary | ICD-10-CM | POA: Diagnosis not present

## 2022-03-10 DIAGNOSIS — N401 Enlarged prostate with lower urinary tract symptoms: Secondary | ICD-10-CM | POA: Diagnosis not present

## 2022-03-10 DIAGNOSIS — R350 Frequency of micturition: Secondary | ICD-10-CM | POA: Diagnosis not present

## 2022-04-29 DIAGNOSIS — E669 Obesity, unspecified: Secondary | ICD-10-CM | POA: Diagnosis not present

## 2022-04-29 DIAGNOSIS — I10 Essential (primary) hypertension: Secondary | ICD-10-CM | POA: Diagnosis not present

## 2022-04-29 DIAGNOSIS — Z6833 Body mass index (BMI) 33.0-33.9, adult: Secondary | ICD-10-CM | POA: Diagnosis not present

## 2022-04-29 DIAGNOSIS — R251 Tremor, unspecified: Secondary | ICD-10-CM | POA: Diagnosis not present

## 2022-05-26 ENCOUNTER — Encounter: Payer: Self-pay | Admitting: *Deleted

## 2022-05-27 ENCOUNTER — Ambulatory Visit: Payer: BC Managed Care – PPO | Admitting: Neurology

## 2022-05-27 ENCOUNTER — Encounter: Payer: Self-pay | Admitting: Neurology

## 2022-05-27 VITALS — BP 145/84 | HR 60 | Ht 75.0 in | Wt 261.2 lb

## 2022-05-27 DIAGNOSIS — G20 Parkinson's disease: Secondary | ICD-10-CM | POA: Diagnosis not present

## 2022-05-27 DIAGNOSIS — F419 Anxiety disorder, unspecified: Secondary | ICD-10-CM | POA: Diagnosis not present

## 2022-05-27 DIAGNOSIS — F320 Major depressive disorder, single episode, mild: Secondary | ICD-10-CM | POA: Diagnosis not present

## 2022-05-27 DIAGNOSIS — G479 Sleep disorder, unspecified: Secondary | ICD-10-CM | POA: Diagnosis not present

## 2022-05-27 MED ORDER — ROPINIROLE HCL 0.25 MG PO TABS: 0.7500 mg | ORAL_TABLET | Freq: Three times a day (TID) | ORAL | 3 refills | Status: DC

## 2022-05-27 NOTE — Progress Notes (Signed)
Subjective:    Patient ID: Stuart Lucero is a 56 y.o. male.  HPI    Star Age, MD, PhD [facility] [address], [address] [place], [place] [address]  Dear Dr. Venetia Maxon,  I saw your patient, Stuart Lucero, for your kind request to clinic today for initial consultation of his tremor.  The patient is unaccompanied today.  As you know, Stuart Lucero is a 56 year old right-handed gentleman with an underlying medical history of cardiomyopathy, allergic rhinitis, anxiety, hypertension, hyperlipidemia, and mild obesity, who reports an approximately 6 to 18-monthhistory of trembling in the left upper extremity.  He has a history of COVID twice.  First time he had it in 2021 and second time in December 2022.  He reports quite a bit of stress including marital stress.  He eventually got divorced after about 28 years.  He got divorced in 2021 and now lives alone, has 1 dog in the household, has 3 children from his last marriage, 2 sons, ages 260and 227and 1 daughter, age 57  He also has a son from his previous marriage, age 57  In the past few years he has had increase in anxiety and also depressive symptoms.  He is currently not on any antidepressant.  He reports that his grades are starting to function.  He is in the form of coffee, 1 or 2 cups/day and drinks alcohol nearly daily, approximately 5 times a week, 2-3 drinks at a time.  He quit smoking in 2020.  He has no family history of tremors or Parkinson's disease.  His father lived to be 856and had heart disease and stroke, had open heart surgery.  His mom died in her early 825s  He has a 669year old brother and a 524year old sister.  He works for [employer].  He has recently started going to the gym about 3 to 4 weeks ago.  He goes about 4 times a week.  He does not sleep well, sleep is interrupted, he is not sure if he snores.  He has a history of snoring.  He has nocturia twice per average night.  Denies morning  headaches.  He sleeps on his sides, he typically does not sleep on his back, had back surgery 3 times, last surgery in 2014.  He has not noticed a change in his handwriting.  He has no tremor on the right side, he has not fallen, denies any significant constipation. I reviewed your office note from 01/21/2022, at which time his post-COVID symptoms were discussed.  He had experienced several different lingering symptoms including fatigue, shortness of breath, abdominal pressure, increased urination, increased anxiety and tremulousness.  He had blood work through your office at the time including CBC with differential and platelets, CMP, lipid panel, PSA.  He was on low-dose alprazolam 0.25 mg twice daily as needed.  I reviewed blood test results from 01/21/2022: PSA was in the normal range at 0.63, CBC with differential showed WBC of 450, RBC slightly below normal at 4.24, hemoglobin slightly below normal at 13.6, platelets 211, CMP with glucose of 108, BUN 20, creatinine 1.1, AST 25, ALT 28, alk phos 72, sodium 138, potassium 3.7, lipid panel showed triglycerides of 119, total cholesterol 194, HDL 38, LDL 132, elevated.  Urinalysis with benign results.  I reviewed your office note from 04/29/2022.  He reported intermittent uncontrollable shaking of his left hand and arm.  It would affect his lower lip and jaw on the left.  He was not always sleeping very well but tremor was better when he was able to get better sleep.  He has never had a sleep study.  He has been on carvedilol.  A home sleep test was considered.  His Past Medical History Is Significant For: Past Medical History:  Diagnosis Date   Allergic rhinitis    Cardiomyopathy due to hypertension, without heart failure ([place])    [phone]   Diastolic dysfunction 0277   Hyperlipidemia    Hypertension     His Past Surgical History Is Significant For: Past Surgical History:  Procedure Laterality Date   BACK SURGERY     2014   LIPOMA EXCISION  2017     His Family History Is Significant For: Family History  Problem Relation Age of Onset   Heart disease Father    Diabetes Father    Stroke Brother    Hypertension Brother    Tremor Neg Hx    Parkinson's disease Neg Hx     His Social History Is Significant For: Social History   Socioeconomic History   Marital status: Divorced    Spouse name: Not on file   Number of children: Not on file   Years of education: Not on file   Highest education level: Not on file  Occupational History   Not on file  Tobacco Use   Smoking status: Former    Packs/day: 0.50    Years: 5.00    Pack years: 2.50    Types: Cigarettes    Quit date: 12/27/2017    Years since quitting: 4.4   Smokeless tobacco: Never  Vaping Use   Vaping Use: Never used  Substance and Sexual Activity   Alcohol use: Yes    Alcohol/week: 10.0 standard drinks    Types: 6 Cans of beer, 4 Shots of liquor per week    Comment: Occasional beer drinker- 2 beers   Drug use: Never   Sexual activity: Not on file  Other Topics Concern   Not on file  Social History Narrative   Not on file   Social Determinants of Health   Financial Resource Strain: Not on file  Food Insecurity: Not on file  Transportation Needs: Not on file  Physical Activity: Not on file  Stress: Not on file  Social Connections: Not on file    His Allergies Are:  Allergies  Allergen Reactions   Oxycodone-Acetaminophen Shortness Of Breath   Gabapentin Other (See Comments)    Makes me angry  :   His Current Medications Are:  Outpatient Encounter Medications as of 05/27/2022  Medication Sig   ALPRAZolam (XANAX) 0.25 MG tablet Take by mouth as needed.   amLODipine (NORVASC) 5 MG tablet Take 5 mg by mouth daily.   benzonatate (TESSALON) 200 MG capsule Take 200 mg by mouth every 8 (eight) hours as needed for cough.   carvedilol (COREG) 12.5 MG tablet Take by mouth.   indapamide (LOZOL) 2.5 MG tablet Take by mouth daily. 1/2 to 1 tablet daily    metroNIDAZOLE (METROGEL) 1 % gel Apply topically daily. (Patient not taking: Reported on 05/27/2022)   No facility-administered encounter medications on file as of 05/27/2022.  :   Review of Systems:  Out of a complete 14 point review of systems, all are reviewed and negative with the exception of these symptoms as listed below:  Review of Systems  Neurological:        Pt is here for tremors pt states tremors are in his  left hand ,arm and mouth . Pt states at times tremors moves to different parts of his body    Objective:  Neurological Exam  Physical Exam Physical Examination:   Vitals:   05/27/22 0759  BP: (!) 145/84  Pulse: 60    General Examination: The patient is a very pleasant 56 y.o. male in no acute distress. He appears well-developed and well-nourished and well groomed.  He is mildly anxious appearing in the beginning of the appointment, at the conclusion of the appointment he reported seeing lightheaded and warm, anxious, took his Xanax and I asked him to lay down on the exam table for a little bit, he drank some water and felt better.  He declined for [place] to call someone to pick him up.  He ported that he is able to drive after taking Xanax typically.  HEENT: Normocephalic, atraumatic, pupils are equal, round and reactive to light, extraocular tracking is fairly well-preserved, moderate nuchal rigidity noted, mild decrease in range of motion actively in the neck with neck turns.  No carotid bruits.  Hearing grossly intact.  Speech with mild hypophonia, intermittent lower lip and jaw tremor noted.  Face with mild to moderate facial masking noted, otherwise symmetric. Oropharynx exam reveals: mild mouth dryness, adequate dental hygiene  Chest: Clear to auscultation without wheezing, rhonchi or crackles noted.  Heart: S1+S2+0, regular and normal without murmurs, rubs or gallops noted.   Abdomen: Soft, non-tender and non-distended with normal bowel sounds appreciated on  auscultation.  Extremities: There is no pitting edema in the distal lower extremities bilaterally.   Skin: Warm and dry without trophic changes noted.   Musculoskeletal: exam reveals no obvious joint deformities.   Neurologically:  Mental status: The patient is awake, alert and oriented in all 4 spheres. His immediate and remote memory, attention, language skills and fund of knowledge are appropriate. There is no evidence of aphasia, agnosia, apraxia or anomia. Speech is clear with normal prosody and enunciation. Thought process is linear. Mood is normal and affect is mildly anxious.  Cranial nerves II - XII are as described above under HEENT exam.  Motor exam: Normal bulk, strength and tone is note noted, he has mild increase in tone in the left upper extremity.  He has a mild resting tremor in the left upper extremity.    On 05/27/2022: on Archimedes spiral drawing, he has no significant trembling, handwriting is legible, not tremulous, on the smaller side.    He has a slight postural tremor on the left upper extremity, no intention tremor, no action tremor.  Fine motor skills and coordination: Finger taps and hand movements are mildly impaired on the right and mild to moderately so on the left, rapid alternating patting mildly impaired on the left only.  Foot taps are mild to moderately impaired on the left, better on the right.  Reflexes 1+. Cerebellar testing: No dysmetria or intention tremor. There is no truncal or gait ataxia.  Finger-to-nose unremarkable. Sensory exam: intact to light touch in the upper and lower extremities.  Gait, station and balance: He stands without difficulty, posture is slightly stooped for age, he walks slowly but with good stride length, decreased arm swing on the left.  Balance is preserved, no difficulty turning.  Assessment and Plan:   In summary, Stuart Lucero is a very pleasant 56 y.o.-year old male with an underlying medical history of cardiomyopathy,  allergic rhinitis, anxiety, hypertension, hyperlipidemia, and mild obesity, who presents for evaluation of his tremor disorder of  approximately 6 to 8 months duration.  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  I had a long chat with the patient  about His symptoms, my findings and the diagnosis of parkinsonism/Parksinson's disease, its prognosis and treatment options. We talked about medical treatments and non-pharmacological approaches. We talked about the importance of maintaining a healthy lifestyle in general. I encouraged the patient to eat healthy, exercise daily and keep well hydrated, to keep a scheduled bedtime and wake time routine, to not skip any meals and eat healthy snacks in between meals and to have protein with every meal. In particular, I stressed the importance of regular exercise.  He is advised to scale back on his nearly daily alcohol consumption and limit himself to less than 1 serving per day.  He was encouraged to talk to you about anxiety and stress management as well as depression management, he may be a good candidate for a antidepressant medication.  He was advised to bring his Xanax for his MRI as he reported anxiety/claustrophobia with MRI testing before. As far as further diagnostic testing is concerned, I suggested we proceed with a brain MRI.  I would also like to consider a DaTscan in the future.  I explained the brain scan to them and the difference between the DaTscan and a regular MRI.  As far as medications are concerned, I recommended the following at this time: We talked about different treatment options but I think it would be a good candidate for dopamine agonist.  We talked about expectations and potential common side effects as well as rare possibility of impulse control disorder with dopamine agonist.  He was given written  instructions.  He did have anxiety towards the end of the visit and took a Xanax.  He drank water and briefly laid down on the examination table but declined for [place] to call someone to pick him up.  He will report a okay driving after taking Xanax and he was able to sit up and move independently to check out without any problems.  He is advised to follow-up in 3 months in this clinic and we will call him with his MRI results.  He was encouraged to call or email through [place] with any interim questions or concerns.  I answered all his questions today and he was in agreement.  This was an extended visit over over 1 hour.   Thank you very much for allowing me to participate in the care of this nice patient. If I can be of any further assistance to you please do not hesitate to call me at [phone].  Sincerely,   Star Age, MD, PhD

## 2022-05-27 NOTE — Patient Instructions (Addendum)
It was nice to meet you today.  Based on your history and exam findings, I believe you have Parkinson's disease, affecting your left side primarily.  As discussed, this disease does progress with time. It can affect your balance, your memory, your mood, your bowel and bladder function, your posture, balance and walking and your activities of daily living. However, there are good supportive treatments and symptomatic treatments available, so most patients have a change to a good quality life and life expectancy is not typically altered. Overall you are doing fairly well but I do want to suggest a few things today:  Remember to drink plenty of fluid at least 6 glasses (8 oz each), eat healthy meals and do not skip any meals. Try to eat protein with a every meal and eat a healthy snack such as fruit or nuts in between meals. Try to keep a regular sleep-wake schedule and try to exercise daily, particularly in the form of walking, 20-30 minutes a day, if you can.   Please talk to your primary care physician about anxiety and depression management, you may do well with a low-dose antidepressant medication such as Lexapro or Celexa.  For your Parkinson symptoms, we will start you on a medication called Requip (generic name: ropinirole) 0.25 mg: Take one pill twice daily (morning and lunchtime) for one week, then one pill 3 times a day (morning, lunch and evening) for one week, then 2 pills 3 times a day for one week, then 3 pills three times a day thereafter. Common side effects reported are: Sedation, sleepiness, nausea, vomiting, and rare side effects are confusion, hallucinations, swelling in legs, and abnormal behaviors, including impulse control problems, which can manifest as excessive eating, obsessions with food or gambling, or hypersexuality.  Please remember, no medication is without potential side effects and not every medication is right for every patient with PD.   Try to stay active physically and  mentally. Engage in social activities in your community and with your family and try to keep up with current events by reading the newspaper or watching the news. Try to do word puzzles and you may like to do puzzles and brain games on the computer such as on [email or web address]   Please curb your alcohol consumption and limit yourself to up to 1 serving per day.  You can consider taking coenzyme Q 10 over the counter: 400 mg 3 times a day.   We will do a brain scan, called MRI and call you with the test results. We will have to schedule you for this on a separate date. This test requires authorization from your insurance, and we will take care of the insurance process.  Later on, we will consider a DaT scan: This is a specialized brain scan designed to help with diagnosis of tremor disorders. A radioactive marker gets injected and the uptake is measured in the brain and compared to normal controls and right side is compared to the left, a change in uptake can help with diagnosis of certain tremor disorders. A brain MRI on the other hand is a brain scan that helps look at the brain structure in more detail overall and look for age-related changes, blood vessel related changes and look for stroke and volume loss which we call atrophy.   Please follow-up in about 3 months. You can email [place] through [place] or call for any interim questions or concerns.   For any emergencies you know to call 911 or go to the  nearest emergency room.

## 2022-06-14 ENCOUNTER — Telehealth: Payer: Self-pay | Admitting: Neurology

## 2022-06-14 NOTE — Telephone Encounter (Signed)
Stuart Lucero: [id number] exp.06/14/2022 - 07/13/2022 sent to [facility] for open MRI

## 2022-07-14 ENCOUNTER — Other Ambulatory Visit: Payer: Self-pay | Admitting: Neurology

## 2022-07-14 DIAGNOSIS — F320 Major depressive disorder, single episode, mild: Secondary | ICD-10-CM

## 2022-07-14 DIAGNOSIS — G479 Sleep disorder, unspecified: Secondary | ICD-10-CM

## 2022-07-14 DIAGNOSIS — G20 Parkinson's disease: Secondary | ICD-10-CM

## 2022-07-14 DIAGNOSIS — F419 Anxiety disorder, unspecified: Secondary | ICD-10-CM

## 2022-07-19 NOTE — Telephone Encounter (Signed)
Okay to change order to open MRI. He may take Xanax for MRI, it looks like he has a prescription on file.

## 2022-07-20 NOTE — Telephone Encounter (Signed)
Patient did not get his MRI done. I redid the auth and sent it to [facility] again. Dillon Bjork: [id number] exp. 07/20/22-08/18/22 sent to [facility]

## 2022-07-20 NOTE — Telephone Encounter (Signed)
SCHEDULED 8/17 1030AM

## 2022-08-03 NOTE — Telephone Encounter (Signed)
I usually prescribe Xanax for MRI scans.  It looks like he already has a prescription for this.  Has he tried any other medication before for a scan?

## 2022-08-12 DIAGNOSIS — R42 Dizziness and giddiness: Secondary | ICD-10-CM | POA: Diagnosis not present

## 2022-08-12 DIAGNOSIS — G20 Parkinson's disease: Secondary | ICD-10-CM | POA: Diagnosis not present

## 2022-08-12 DIAGNOSIS — H538 Other visual disturbances: Secondary | ICD-10-CM | POA: Diagnosis not present

## 2022-09-06 DIAGNOSIS — Z6832 Body mass index (BMI) 32.0-32.9, adult: Secondary | ICD-10-CM | POA: Diagnosis not present

## 2022-09-06 DIAGNOSIS — H00019 Hordeolum externum unspecified eye, unspecified eyelid: Secondary | ICD-10-CM | POA: Diagnosis not present

## 2022-09-07 ENCOUNTER — Encounter: Payer: Self-pay | Admitting: *Deleted

## 2022-09-07 ENCOUNTER — Encounter: Payer: Self-pay | Admitting: Neurology

## 2022-09-07 ENCOUNTER — Ambulatory Visit: Payer: BC Managed Care – PPO | Admitting: Neurology

## 2022-09-07 VITALS — BP 124/79 | HR 61 | Ht 75.0 in | Wt 260.6 lb

## 2022-09-07 DIAGNOSIS — R5383 Other fatigue: Secondary | ICD-10-CM | POA: Diagnosis not present

## 2022-09-07 DIAGNOSIS — G479 Sleep disorder, unspecified: Secondary | ICD-10-CM

## 2022-09-07 DIAGNOSIS — E669 Obesity, unspecified: Secondary | ICD-10-CM | POA: Diagnosis not present

## 2022-09-07 DIAGNOSIS — F419 Anxiety disorder, unspecified: Secondary | ICD-10-CM

## 2022-09-07 DIAGNOSIS — G20 Parkinson's disease: Secondary | ICD-10-CM

## 2022-09-07 DIAGNOSIS — Z9189 Other specified personal risk factors, not elsewhere classified: Secondary | ICD-10-CM

## 2022-09-07 DIAGNOSIS — Z820 Family history of epilepsy and other diseases of the nervous system: Secondary | ICD-10-CM

## 2022-09-07 DIAGNOSIS — F320 Major depressive disorder, single episode, mild: Secondary | ICD-10-CM

## 2022-09-07 MED ORDER — ROPINIROLE HCL 1 MG PO TABS: 1.0000 mg | ORAL_TABLET | Freq: Three times a day (TID) | ORAL | 3 refills | Status: DC

## 2022-09-07 NOTE — Progress Notes (Signed)
Subjective:    Patient ID: Stuart Lucero is a 56 y.o. male.  HPI    Interim history:    Stuart Lucero is a 56 year old right-handed gentleman with an underlying medical history of cardiomyopathy, allergic rhinitis, anxiety, hypertension, hyperlipidemia, and mild obesity, who presents for follow-up consultation of his parkinsonism.  The patient is unaccompanied today.  I first met him at the request of his primary care physician on 05/28/2019; he reported a 6 to 74-monthhistory of tremor in his left upper extremity.  Examination was in keeping with parkinsonism but the left-sided predominance noted.  He was advised to proceed with symptomatic treatment with ropinirole.  He was advised to pursue a brain MRI.  I encouraged him to cut back on his nearly daily alcohol consumption.  He reported stress and anxiety.  He had a brain MRI with and without contrast through [facility] on 08/12/2022 and I reviewed the results: Impression: Normal MRI of the brain.  Polyp or retention cyst in the left maxillary sinus.  Today, 09/07/2022: He reports having had some tiredness during the day.  When he first started ropinirole he felt a little confused but the confusion subsided.  He does not always rest well, but he has been he sleeps better, his tremor is less.  He has not quite increased it to 3 pills 3 times daily, continues to take 0.25 mg strength 2 pills 3 times daily.  He can increase it at this time.  He forgot that he was supposed to go up further.  He has been exercising regularly but went to the gym on a scheduled basis first thing in the morning, in the past month has not actually done so.  He does get a lot of walking down at work.  He works from 7 AM to 3 PM but does work longer hours and overtime.  He has not fallen, he tries to hydrate well, limits his caffeine to 2 cups of coffee, sometimes Lipton tea.   Previously:   05/27/22: (He) reports an approximately 6 to 812-monthistory of trembling in the left  upper extremity.  He has a history of COVID twice.  First time he had it in 2021 and second time in December 2022.  He reports quite a bit of stress including marital stress.  He eventually got divorced after about 28 years.  He got divorced in 2021 and now lives alone, has 1 dog in the household, has 3 children from his last marriage, 2 sons, ages 2625nd 2440nd 1 daughter, age 866 He also has a son from his previous marriage, age 57 In the past few years he has had increase in anxiety and also depressive symptoms.  He is currently not on any antidepressant.  He reports that his grades are starting to function.  He is in the form of coffee, 1 or 2 cups/day and drinks alcohol nearly daily, approximately 5 times a week, 2-3 drinks at a time.  He quit smoking in 2020.  He has no family history of tremors or Parkinson's disease.  His father lived to be 8886nd had heart disease and stroke, had open heart surgery.  His mom died in her early 8070s He has a 6161ear old brother and a 5726ear old sister.  He works for [employer].  He has recently started going to the gym about 3 to 4 weeks ago.  He goes about 4 times a week.  He does not sleep well, sleep is  interrupted, he is not sure if he snores.  He has a history of snoring.  He has nocturia twice per average night.  Denies morning headaches.  He sleeps on his sides, he typically does not sleep on his back, had back surgery 3 times, last surgery in 2014.  He has not noticed a change in his handwriting.  He has no tremor on the right side, he has not fallen, denies any significant constipation. I reviewed your office note from 01/21/2022, at which time his post-COVID symptoms were discussed.  He had experienced several different lingering symptoms including fatigue, shortness of breath, abdominal pressure, increased urination, increased anxiety and tremulousness.  He had blood work through your office at the time including CBC with differential and platelets, CMP,  lipid panel, PSA.  He was on low-dose alprazolam 0.25 mg twice daily as needed.  I reviewed blood test results from 01/21/2022: PSA was in the normal range at 0.63, CBC with differential showed WBC of 450, RBC slightly below normal at 4.24, hemoglobin slightly below normal at 13.6, platelets 211, CMP with glucose of 108, BUN 20, creatinine 1.1, AST 25, ALT 28, alk phos 72, sodium 138, potassium 3.7, lipid panel showed triglycerides of 119, total cholesterol 194, HDL 38, LDL 132, elevated.  Urinalysis with benign results.  I reviewed your office note from 04/29/2022.  He reported intermittent uncontrollable shaking of his left hand and arm.  It would affect his lower lip and jaw on the left.  He was not always sleeping very well but tremor was better when he was able to get better sleep.  He has never had a sleep study.  He has been on carvedilol.  A home sleep test was considered.  His Past Medical History Is Significant For: Past Medical History:  Diagnosis Date   Allergic rhinitis    Cardiomyopathy due to hypertension, without heart failure ([place])    [id number]   Diastolic dysfunction 2992   Hyperlipidemia    Hypertension     His Past Surgical History Is Significant For: Past Surgical History:  Procedure Laterality Date   BACK SURGERY     2014   LIPOMA EXCISION  2017    His Family History Is Significant For: Family History  Problem Relation Age of Onset   Heart disease Father    Diabetes Father    Stroke Brother    Hypertension Brother    Tremor Neg Hx    Parkinson's disease Neg Hx     His Social History Is Significant For: Social History   Socioeconomic History   Marital status: Divorced    Spouse name: Not on file   Number of children: Not on file   Years of education: Not on file   Highest education level: Not on file  Occupational History   Not on file  Tobacco Use   Smoking status: Former    Packs/day: 0.50    Years: 5.00    Total pack years: 2.50    Types: Cigarettes     Quit date: 12/27/2017    Years since quitting: 4.6   Smokeless tobacco: Never  Vaping Use   Vaping Use: Never used  Substance and Sexual Activity   Alcohol use: Yes    Alcohol/week: 7.0 standard drinks of alcohol    Types: 7 Cans of beer per week   Drug use: Never   Sexual activity: Not on file  Other Topics Concern   Not on file  Social History Narrative   Not  on file   Social Determinants of Health   Financial Resource Strain: Not on file  Food Insecurity: Not on file  Transportation Needs: Not on file  Physical Activity: Not on file  Stress: Not on file  Social Connections: Not on file    His Allergies Are:  Allergies  Allergen Reactions   Oxycodone-Acetaminophen Shortness Of Breath   Gabapentin Other (See Comments)    Makes me angry  :   His Current Medications Are:  Outpatient Encounter Medications as of 09/07/2022  Medication Sig   ALPRAZolam (XANAX) 0.25 MG tablet Take by mouth as needed for anxiety.   amLODipine (NORVASC) 5 MG tablet Take 5 mg by mouth daily.   carvedilol (COREG) 12.5 MG tablet Take by mouth.   erythromycin ophthalmic ointment SMARTSIG:In Eye(s)   indapamide (LOZOL) 2.5 MG tablet Take by mouth daily. 1/2 to 1 tablet daily   rOPINIRole (REQUIP) 0.25 MG tablet TAKE 3 TABLETS BY MOUTH 3 TIMES DAILY. FOLLOW TITRATION INSTRUCTIONS PROVIDED SEPARATELY IN WRITING.   tamsulosin (FLOMAX) 0.4 MG CAPS capsule Take 0.4 mg by mouth at bedtime.   benzonatate (TESSALON) 200 MG capsule Take 200 mg by mouth every 8 (eight) hours as needed for cough. (Patient not taking: Reported on 09/07/2022)   metroNIDAZOLE (METROGEL) 1 % gel Apply topically daily. (Patient not taking: Reported on 05/27/2022)   No facility-administered encounter medications on file as of 09/07/2022.  :  Review of Systems:  Out of a complete 14 point review of systems, all are reviewed and negative with the exception of these symptoms as listed below:  Review of Systems  Neurological:         Pt here for Parkinsonism f/u Pt states still have tremors Pt states he wants to discuss requip because he is having brain fog and fatigue, some confusion. Pt states confusion has stopped        Objective:  Neurological Exam  Physical Exam Physical Examination:   Vitals:   09/07/22 0939  BP: 124/79  Pulse: 61    General Examination: The patient is a very pleasant 56 y.o. male in no acute distress. He appears well-developed and well-nourished and well groomed.   HEENT: Normocephalic, atraumatic, pupils are equal, round and reactive to light, extraocular tracking is fairly well-preserved, moderate nuchal rigidity noted, mild decrease in range of motion actively in the neck with neck turns.  No carotid bruits.  Hearing grossly intact.  Speech with mild hypophonia, intermittent lower lip and jaw tremor noted.  Face with mild to moderate facial masking noted, otherwise symmetric. Oropharynx exam reveals: mild mouth dryness, adequate dental hygiene, moderate airway crowding with the larger uvula.  Mallampati class II, tonsils on the smaller side.  Neck circumference 18 three-quarter inches.  Tongue protrudes centrally and palate elevates symmetrically.   Chest: Clear to auscultation without wheezing, rhonchi or crackles noted.   Heart: S1+S2+0, regular and normal without murmurs, rubs or gallops noted.    Abdomen: Soft, non-tender and non-distended.   Extremities: There is no pitting edema in the distal lower extremities bilaterally.    Skin: Warm and dry without trophic changes noted.    Musculoskeletal: exam reveals no obvious joint deformities.    Neurologically:  Mental status: The patient is awake, alert and oriented in all 4 spheres. His immediate and remote memory, attention, language skills and fund of knowledge are appropriate. There is no evidence of aphasia, agnosia, apraxia or anomia. Speech is clear with normal prosody and enunciation. Thought process is linear.  Mood is  normal and affect is mildly anxious.  Cranial nerves II - XII are as described above under HEENT exam.  Motor exam: Normal bulk, strength and tone is note noted, with the exception of mild increase in tone in the left upper extremity.  He has a mild resting tremor in the left upper extremity.   (On 05/27/2022: on Archimedes spiral drawing, he has no significant trembling, handwriting is legible, not tremulous, on the smaller side.)     He has a slight postural tremor on the left upper extremity, no intention tremor, no action tremor.  Fine motor skills and coordination: Finger taps and hand movements are mildly impaired on the right and mild to moderately so on the left, rapid alternating patting mildly impaired on the left only.  Foot taps are mild to moderately impaired on the left, better on the right.  Reflexes 1+ to 2+ throughout. Cerebellar testing: No dysmetria or intention tremor. There is no truncal or gait ataxia. Sensory exam: intact to light touch in the upper and lower extremities.  Gait, station and balance: He stands without difficulty, posture is slightly stooped for age, he walks slowly but with good stride length, decreased arm swing on the left.  Balance is preserved, no difficulty turning.   Assessment and Plan:    In summary, Stuart Lucero is a very pleasant 56 year old male with an underlying medical history of cardiomyopathy, allergic rhinitis, anxiety, hypertension, hyperlipidemia, and mild obesity, who presents for follow-up consultation of his parkinsonism of approximately 9 to 12 months duration with left-sided predominance in his symptoms lateralization on exam.  History and examination are in keeping with left-sided predominant Parkinson's disease.  Brain MRI from August 2023 was benign.  We talked about pursuing a DaTscan, we mutually agreed to forego this test for now.  He has been on symptomatic treatment with low-dose ropinirole since early June 2023.  He has been taking  0.5 mg 3 times daily, has not quite increase it to 0.75 mg 3 times daily but is willing to do so.  He is advised to continue to watch for side effects.  He has had some daytime tiredness.  He may be at risk for obstructive sleep apnea and is agreeable to pursuing a sleep study.  I explained CPAP therapy to him.  He does endorse a family history of sleep apnea in his brother.  Once he finishes his current prescription for ropinirole 0.25 mg strength, 3 pills 3 times daily I would like for him to increase it further to 1 mg strength 1 pill 3 times daily thereafter.  He is advised to follow-up after sleep testing, we will consider CPAP or AutoPap therapy depending on the test results.  We will call him to schedule his sleep study after insurance authorization is obtained.  We will plan to follow-up accordingly.  We talked about the importance of maintaining a healthy lifestyle and good nutrition, good hydration with water and regular exercise.  I answered all his questions today and he was in agreement.   I spent 40 minutes in total face-to-face time and in reviewing records during pre-charting, more than 50% of which was spent in counseling and coordination of care, reviewing test results, reviewing medications and treatment regimen and/or in discussing or reviewing the diagnosis of PD, the prognosis and treatment options. Pertinent laboratory and imaging test results that were available during this visit with the patient were reviewed by me and considered in my medical decision making (see  chart for details).

## 2022-09-07 NOTE — Patient Instructions (Addendum)
It was nice to see you again today.    As discussed, please increase your ropinirole 0.25 mg strength to 3 tablets 3 times daily for now until you finish your current prescription.  With the next prescription I would like for you to increase the ropinirole to 1 mg strength, take 1 pill 3 times daily.    Based on your symptoms and your exam I believe we should look for an underlying organic sleep disorder, such as obstructive sleep apnea (OSA) with a sleep study.   If your Sleep study shows obstructive sleep apnea, we can consider treatment with a CPAP machine or AutoPap machine. Studies have shown that patients with sleep apnea can have an increased frequency of involuntary behaviors at night, Such as sleep talking and sleepwalking.   The risks and ramifications of moderate to severe obstructive sleep apnea or OSA are: Cardiovascular disease, including congestive heart failure, stroke, difficult to control hypertension, arrhythmias, and even type 2 diabetes has been linked to untreated OSA. Sleep apnea causes disruption of sleep and sleep deprivation in most cases, which, in turn, can cause recurrent headaches, problems with memory, mood, concentration, focus, and vigilance. Most people with untreated sleep apnea report excessive daytime sleepiness, which can affect their ability to drive. Please do not drive if you feel sleepy.   We will call you after your sleep study to advise about the results (most likely, you will hear from one of our nurses).    Our sleep lab administrative assistant, will call you to schedule your sleep study. If you don't hear back from her by about 2 weeks from now, please feel free to call her at [phone]. This is her direct line and please leave a message with your phone number to call back if you get the voicemail box. She will call back as soon as possible.   We will send you a questionnaire to fill out on my chart, it is called the Epworth sleepiness Scale.  If you  would, please fill it out and send it back via MyChart response.

## 2022-09-17 DIAGNOSIS — N401 Enlarged prostate with lower urinary tract symptoms: Secondary | ICD-10-CM | POA: Diagnosis not present

## 2022-09-17 DIAGNOSIS — R350 Frequency of micturition: Secondary | ICD-10-CM | POA: Diagnosis not present

## 2022-09-17 DIAGNOSIS — R351 Nocturia: Secondary | ICD-10-CM | POA: Diagnosis not present

## 2022-09-29 ENCOUNTER — Ambulatory Visit: Payer: BC Managed Care – PPO | Admitting: Neurology

## 2022-09-29 ENCOUNTER — Telehealth: Payer: Self-pay | Admitting: Neurology

## 2022-09-29 DIAGNOSIS — G4733 Obstructive sleep apnea (adult) (pediatric): Secondary | ICD-10-CM | POA: Diagnosis not present

## 2022-09-29 DIAGNOSIS — Z820 Family history of epilepsy and other diseases of the nervous system: Secondary | ICD-10-CM

## 2022-09-29 DIAGNOSIS — G479 Sleep disorder, unspecified: Secondary | ICD-10-CM

## 2022-09-29 DIAGNOSIS — Z9189 Other specified personal risk factors, not elsewhere classified: Secondary | ICD-10-CM

## 2022-09-29 DIAGNOSIS — G20C Parkinsonism, unspecified: Secondary | ICD-10-CM

## 2022-09-29 DIAGNOSIS — R5383 Other fatigue: Secondary | ICD-10-CM

## 2022-09-29 DIAGNOSIS — E669 Obesity, unspecified: Secondary | ICD-10-CM

## 2022-09-29 NOTE — Telephone Encounter (Signed)
MAIL OUT HST- [employer] Josem Kaufmann: [id number] (exp. 09/21/22 to 11/19/22)..  It will be mailed out on 09/30/22.

## 2022-10-07 NOTE — Progress Notes (Signed)
See procedure note.

## 2022-10-08 NOTE — Addendum Note (Signed)
Addended by: Star Age on: 10/08/2022 10:37 AM   Modules accepted: Orders

## 2022-10-08 NOTE — Procedures (Signed)
   [facility]  HOME SLEEP TEST (Watch PAT) REPORT  STUDY DATE: 10/06/2022  DOB: 08-Jun-1966  MRN: [id number]  ORDERING CLINICIAN: Star Age, MD, PhD   REFERRING CLINICIAN: Elenore Paddy, NP   CLINICAL INFORMATION/HISTORY: 56 year old male with a history of cardiomyopathy, allergic rhinitis, anxiety, hypertension, hyperlipidemia, mild obesity, and parkinsonism who reports poor sleep and daytime somnolence.  BMI: 32.7 kg/m  FINDINGS:   Sleep Summary:   Total Recording Time (hours, min): 8 hours, 44 min  Total Sleep Time (hours, min):  7 hours, 43 min  Percent REM (%):    27.8%   Respiratory Indices:   Calculated pAHI (per hour):  22.2/hour         REM pAHI:    40.8/hour       NREM pAHI: 15.2/hour  Central pAHI: 1.4/hour  Oxygen Saturation Statistics:    Oxygen Saturation (%) Mean: 94%   Minimum oxygen saturation (%):                 86%   O2 Saturation Range (%): 86 - 100%    O2 Saturation (minutes) <=88%: 0.8 min  Pulse Rate Statistics:   Pulse Mean (bpm):    56/min    Pulse Range (45 - 96/min)   IMPRESSION: OSA (obstructive sleep apnea)   RECOMMENDATION:   This home sleep test demonstrates moderate obstructive sleep apnea with a total AHI of 22.2/hour and O2 nadir of 86%.  Mild to moderate snoring was detected. Treatment with a positive airway pressure (PAP) device is recommended. The patient will be advised to proceed with an autoPAP titration/trial at home for now. A full night titration study may be considered to optimize treatment settings, monitor proper oxygen saturations and aid with improvement of tolerance and adherence, if needed down the road. Alternative treatment options may include a dental device through dentistry or orthodontics in selected patients or Inspire (hypoglossal nerve stimulator) in carefully selected patients (meeting inclusion criteria).  Concomitant weight loss is recommended (where clinically  appropriate). Please note that untreated obstructive sleep apnea may carry additional perioperative morbidity. Patients with significant obstructive sleep apnea should receive perioperative PAP therapy and the surgeons and particularly the anesthesiologist should be informed of the diagnosis and the severity of the sleep disordered breathing. The patient should be cautioned not to drive, work at heights, or operate dangerous or heavy equipment when tired or sleepy. Review and reiteration of good sleep hygiene measures should be pursued with any patient. Other causes of the patient's symptoms, including circadian rhythm disturbances, an underlying mood disorder, medication effect and/or an underlying medical problem cannot be ruled out based on this test. Clinical correlation is recommended.  The patient and his referring provider will be notified of the test results. The patient will be seen in follow up in sleep clinic at [facility].  I certify that I have reviewed the raw data recording prior to the issuance of this report in accordance with the standards of the American Academy of Sleep Medicine (AASM).  INTERPRETING PHYSICIAN:   Star Age, MD, PhD  Board Certified in Neurology and Sleep Medicine  [facility] [address] [place],  [address] [phone]

## 2022-10-11 ENCOUNTER — Telehealth: Payer: Self-pay

## 2022-10-11 NOTE — Telephone Encounter (Signed)
-----   Message from Star Age, MD sent at 10/08/2022 10:37 AM EDT ----- Patient was last seen for his parkinsonism on 09/07/2022 and we proceeded with sleep evaluation.  He had a home sleep test on 10/06/2022.    Please call and notify the patient that the recent home sleep test showed obstructive sleep apnea in the moderate range. I recommend treatment in the form of autoPAP, which means, that we don't have to bring him in for a sleep study with CPAP, but will let him start using a so called autoPAP machine at home, which is a CPAP-like machine with self-adjusting pressures. We will send the order to a local DME company (of his choice, or as per insurance requirement). The DME representative will fit him with a mask, educate him on how to use the machine, how to put the mask on, etc. I have placed an order in the chart. Please send the order, talk to patient, send report to referring MD. We will need a FU in sleep clinic for 10 weeks post-PAP set up, please arrange that with me or one of our NPs. Also reinforce the need for compliance with treatment. Thanks,   Star Age, MD, PhD [facility])

## 2022-10-11 NOTE — Telephone Encounter (Signed)
I called pt. I advised pt that Dr. Rexene Alberts reviewed their sleep study results and found that pt has moderate osa. Dr. Rexene Alberts recommends that pt start an auto-pap at home. I reviewed PAP compliance expectations with the pt. Pt is agreeable to starting an auto-PAP. I advised pt that an order will be sent to a DME, [facility], and [facility] will call the pt within about one week after they file with the pt's insurance. [facility] will show the pt how to use the machine, fit for masks, and troubleshoot the auto-PAP if needed. A follow up appt was made for insurance purposes with Janett Billow, NP on 01/19/23 at 3:45pm. Pt verbalized understanding to arrive 15 minutes early and bring their auto-PAP. Pt verbalized understanding of results. Pt had no questions at this time but was encouraged to call back if questions arise. I have sent the order to [facility] and have received confirmation that they have received the order.

## 2022-10-26 DIAGNOSIS — G4733 Obstructive sleep apnea (adult) (pediatric): Secondary | ICD-10-CM | POA: Diagnosis not present

## 2022-11-22 ENCOUNTER — Telehealth: Payer: Self-pay

## 2022-11-22 NOTE — Patient Outreach (Signed)
  Care Coordination   Initial Visit Note   11/22/2022 Name: Stuart Lucero MRN: [id number] DOB: [birth year]  Stuart Lucero is a 56 y.o. year old male who sees Routh, Nyoka Cowden, NP for primary care. I spoke with  Stuart Lucero by phone today.  What matters to the patients health and wellness today?  Placed call to patient today and reviewed [facility] care coordination program. Patient reports that he is doing well. Reports he needs to get a couple of RX refilled. States that he has parkinson and he is learning a lot .  Reports that he has started using his CPAP machine but has good nights and bad nights. Denies any needs today.  Encouraged patient to call pharmacy for his refills Encouraged patient to keep trying to get use to his CPAP machine.      SDOH assessments and interventions completed:  No     Care Coordination Interventions:  No, not indicated   Follow up plan: No further intervention required.   Encounter Outcome:  Pt. Visit Completed   .acs

## 2022-11-23 ENCOUNTER — Other Ambulatory Visit: Payer: Self-pay | Admitting: Neurology

## 2022-11-23 DIAGNOSIS — F419 Anxiety disorder, unspecified: Secondary | ICD-10-CM

## 2022-11-23 DIAGNOSIS — F320 Major depressive disorder, single episode, mild: Secondary | ICD-10-CM

## 2022-11-23 DIAGNOSIS — G20C Parkinsonism, unspecified: Secondary | ICD-10-CM

## 2022-11-23 DIAGNOSIS — G479 Sleep disorder, unspecified: Secondary | ICD-10-CM

## 2022-11-25 DIAGNOSIS — G4733 Obstructive sleep apnea (adult) (pediatric): Secondary | ICD-10-CM | POA: Diagnosis not present

## 2022-12-07 ENCOUNTER — Other Ambulatory Visit: Payer: Self-pay | Admitting: Neurology

## 2022-12-07 DIAGNOSIS — G20C Parkinsonism, unspecified: Secondary | ICD-10-CM

## 2022-12-07 DIAGNOSIS — F320 Major depressive disorder, single episode, mild: Secondary | ICD-10-CM

## 2022-12-07 DIAGNOSIS — F419 Anxiety disorder, unspecified: Secondary | ICD-10-CM

## 2022-12-07 DIAGNOSIS — G479 Sleep disorder, unspecified: Secondary | ICD-10-CM

## 2022-12-26 DIAGNOSIS — G4733 Obstructive sleep apnea (adult) (pediatric): Secondary | ICD-10-CM | POA: Diagnosis not present

## 2023-01-11 ENCOUNTER — Other Ambulatory Visit: Payer: Self-pay | Admitting: Neurology

## 2023-01-11 ENCOUNTER — Telehealth: Payer: Self-pay | Admitting: Neurology

## 2023-01-11 ENCOUNTER — Encounter: Payer: Self-pay | Admitting: Adult Health

## 2023-01-11 DIAGNOSIS — G479 Sleep disorder, unspecified: Secondary | ICD-10-CM

## 2023-01-11 DIAGNOSIS — F419 Anxiety disorder, unspecified: Secondary | ICD-10-CM

## 2023-01-11 DIAGNOSIS — F320 Major depressive disorder, single episode, mild: Secondary | ICD-10-CM

## 2023-01-11 DIAGNOSIS — G20C Parkinsonism, unspecified: Secondary | ICD-10-CM

## 2023-01-11 MED ORDER — ROPINIROLE HCL ER 4 MG PO TB24: 4.0000 mg | ORAL_TABLET | Freq: Every day | ORAL | 3 refills | Status: DC

## 2023-01-11 NOTE — Telephone Encounter (Signed)
This has been addressed in another phone message.

## 2023-01-11 NOTE — Addendum Note (Signed)
Addended by: Star Age on: 01/11/2023 03:50 PM   Modules accepted: Orders

## 2023-01-11 NOTE — Telephone Encounter (Signed)
I called pt and relayed the message to pt that Dr. Rexene Alberts wants to try a londer acting ropinirole '4mg'$  ER tablet po qhs and see how he does with that.  I did relay to keep using autopap consistently to assit with daytime sleepiness and headaches.  He has appt with NP coming up.   He was ok to try the longer acting medication.  Will not take others when he receives the ER tab.  He will call back if questions.

## 2023-01-11 NOTE — Telephone Encounter (Signed)
Pt is calling. Stated he needs to talk to nurse about    rOPINIRole (REQUIP) 1 MG tablet. Stated this medication is giving him headaches and felt really tired and his tremors was worse. Pt is asking if he can stay on the 0.25 Requip

## 2023-01-11 NOTE — Telephone Encounter (Signed)
Spoke with Dr Rexene Alberts. Due to pt's reported s/e and still worsened tremor, he may tolerate a long acting form of Requip. We can offer Ropinirole ER 4 mg QHS and see if he would like to try that. Also encourage compliance with autopap which may help with headaches and sleepiness as well.

## 2023-01-11 NOTE — Telephone Encounter (Signed)
Rx signed for generic Requip XL 4 mg once daily.  Nothing further needed.

## 2023-01-11 NOTE — Addendum Note (Signed)
Addended by: Brandon Melnick on: 01/11/2023 03:39 PM   Modules accepted: Orders

## 2023-01-18 NOTE — Progress Notes (Unsigned)
[facility] [address] [place]. [place] [address] [phone]       OFFICE FOLLOW UP NOTE  [name] Date of Birth:  27-Oct-1966 Medical Record Number:  [id number]   Reason for visit: Initial CPAP follow-up    SUBJECTIVE:   CHIEF COMPLAINT:  No chief complaint on file.   HPI:   Stuart Lucero is a 57 year old right-handed gentleman with an underlying medical history of cardiomyopathy, allergic rhinitis, anxiety, hypertension, hyperlipidemia, and mild obesity, who presents for follow-up of his parkinsonism.  An initial visit in 05/2019 with Dr. Rexene Alberts, he complained of 6 to 8 months history of LUE tremor with exam consistent with left-sided dominant parkinsonism, he was started on ropinirole. MRI brain w/wo 07/2022 normal.  Previously seen 09/07/2022 with Dr. Rexene Alberts, ropinirole dosage increased and recommended pursuing sleep study to rule out sleep apnea.     Update 01/19/2023 JM:   Continues on ropinirole, has difficulty tolerating higher dose, currently taking ***.    Completed HST 09/29/2022 which showed moderate OSA with total AHI of 22.2/h and recommend initiation of AutoPap.  Received AutoPap on 10/26/2022.  CPAP compliance report over the past 30 days shows 19 out of 30 usage days and 18 days greater than 4 hours for 60% compliance.  Residual AHI 0.5.  Pressure 95th percentile 8.3 with pressure settings 5-12 with EPR level 3.  Leaks in the 95th percentile 8.5.       History provided from Dr. Guadelupe Sabin prior [facility] note for reference purposes only  09/07/2022: He reports having had some tiredness during the day.  When he first started ropinirole he felt a little confused but the confusion subsided.  He does not always rest well, but he has been he sleeps better, his tremor is less.  He has not quite increased it to 3 pills 3 times daily, continues to take 0.25 mg strength 2 pills 3 times daily.  He can increase it at this time.  He forgot that he was  supposed to go up further.  He has been exercising regularly but went to the gym on a scheduled basis first thing in the morning, in the past month has not actually done so.  He does get a lot of walking down at work.  He works from 7 AM to 3 PM but does work longer hours and overtime.  He has not fallen, he tries to hydrate well, limits his caffeine to 2 cups of coffee, sometimes Lipton tea.     05/27/22: (He) reports an approximately 6 to 55-monthhistory of trembling in the left upper extremity.  He has a history of COVID twice.  First time he had it in 2021 and second time in December 2022.  He reports quite a bit of stress including marital stress.  He eventually got divorced after about 28 years.  He got divorced in 2021 and now lives alone, has 1 dog in the household, has 3 children from his last marriage, 2 sons, ages 280and 230and 1 daughter, age 58  He also has a son from his previous marriage, age 58  In the past few years he has had increase in anxiety and also depressive symptoms.  He is currently not on any antidepressant.  He reports that his grades are starting to function.  He is in the form of coffee, 1 or 2 cups/day and drinks alcohol nearly daily, approximately 5 times a week, 2-3 drinks at a time.  He quit smoking in  2020.  He has no family history of tremors or Parkinson's disease.  His father lived to be 75 and had heart disease and stroke, had open heart surgery.  His mom died in her early 67s.  He has a 13 year old brother and a 50 year old sister.  He works for [employer].  He has recently started going to the gym about 3 to 4 weeks ago.  He goes about 4 times a week.  He does not sleep well, sleep is interrupted, he is not sure if he snores.  He has a history of snoring.  He has nocturia twice per average night.  Denies morning headaches.  He sleeps on his sides, he typically does not sleep on his back, had back surgery 3 times, last surgery in 2014.  He has not noticed a change in  his handwriting.  He has no tremor on the right side, he has not fallen, denies any significant constipation. I reviewed your office note from 01/21/2022, at which time his post-COVID symptoms were discussed.  He had experienced several different lingering symptoms including fatigue, shortness of breath, abdominal pressure, increased urination, increased anxiety and tremulousness.  He had blood work through your office at the time including CBC with differential and platelets, CMP, lipid panel, PSA.  He was on low-dose alprazolam 0.25 mg twice daily as needed.  I reviewed blood test results from 01/21/2022: PSA was in the normal range at 0.63, CBC with differential showed WBC of 450, RBC slightly below normal at 4.24, hemoglobin slightly below normal at 13.6, platelets 211, CMP with glucose of 108, BUN 20, creatinine 1.1, AST 25, ALT 28, alk phos 72, sodium 138, potassium 3.7, lipid panel showed triglycerides of 119, total cholesterol 194, HDL 38, LDL 132, elevated.  Urinalysis with benign results.  I reviewed your office note from 04/29/2022.  He reported intermittent uncontrollable shaking of his left hand and arm.  It would affect his lower lip and jaw on the left.  He was not always sleeping very well but tremor was better when he was able to get better sleep.  He has never had a sleep study.  He has been on carvedilol.  A home sleep test was considered.           ROS:   14 system review of systems performed and negative with exception of those listed in HPI  PMH:  Past Medical History:  Diagnosis Date   Allergic rhinitis    Cardiomyopathy due to hypertension, without heart failure ([place])    [id number]   Diastolic dysfunction 2119   Hyperlipidemia    Hypertension     PSH:  Past Surgical History:  Procedure Laterality Date   BACK SURGERY     2014   LIPOMA EXCISION  2017    Social History:  Social History   Socioeconomic History   Marital status: Divorced    Spouse name: Not on file    Number of children: Not on file   Years of education: Not on file   Highest education level: Not on file  Occupational History   Not on file  Tobacco Use   Smoking status: Former    Packs/day: 0.50    Years: 5.00    Total pack years: 2.50    Types: Cigarettes    Quit date: 12/27/2017    Years since quitting: 5.0   Smokeless tobacco: Never  Vaping Use   Vaping Use: Never used  Substance and Sexual Activity   Alcohol use:  Yes    Alcohol/week: 7.0 standard drinks of alcohol    Types: 7 Cans of beer per week   Drug use: Never   Sexual activity: Not on file  Other Topics Concern   Not on file  Social History Narrative   Not on file   Social Determinants of Health   Financial Resource Strain: Not on file  Food Insecurity: Not on file  Transportation Needs: Not on file  Physical Activity: Not on file  Stress: Not on file  Social Connections: Not on file  Intimate Partner Violence: Not on file    Family History:  Family History  Problem Relation Age of Onset   Heart disease Father    Diabetes Father    Stroke Brother    Hypertension Brother    Tremor Neg Hx    Parkinson's disease Neg Hx     Medications:   Current Outpatient Medications on File Prior to Visit  Medication Sig Dispense Refill   ALPRAZolam (XANAX) 0.25 MG tablet Take by mouth as needed for anxiety.     amLODipine (NORVASC) 5 MG tablet Take 5 mg by mouth daily.     benzonatate (TESSALON) 200 MG capsule Take 200 mg by mouth every 8 (eight) hours as needed for cough. (Patient not taking: Reported on 09/07/2022)     carvedilol (COREG) 12.5 MG tablet Take by mouth.     erythromycin ophthalmic ointment SMARTSIG:In Eye(s)     indapamide (LOZOL) 2.5 MG tablet Take by mouth daily. 1/2 to 1 tablet daily     metroNIDAZOLE (METROGEL) 1 % gel Apply topically daily. (Patient not taking: Reported on 05/27/2022)     rOPINIRole (REQUIP XL) 4 MG 24 hr tablet Take 1 tablet (4 mg total) by mouth at bedtime. 30 tablet 3    tamsulosin (FLOMAX) 0.4 MG CAPS capsule Take 0.4 mg by mouth at bedtime.     No current facility-administered medications on file prior to visit.    Allergies:   Allergies  Allergen Reactions   Oxycodone-Acetaminophen Shortness Of Breath   Gabapentin Other (See Comments)    Makes me angry      OBJECTIVE:  Physical Exam  There were no vitals filed for this visit. There is no height or weight on file to calculate BMI. No results found.   General: well developed, well nourished, seated, in no evident distress Head: head normocephalic and atraumatic.   Neck: supple with no carotid or supraclavicular bruits Cardiovascular: regular rate and rhythm, no murmurs Musculoskeletal: no deformity Skin:  no rash/petichiae Vascular:  Normal pulses all extremities   Neurologic Exam Mental Status: Awake and fully alert. Oriented to place and time. Recent and remote memory intact. Attention span, concentration and fund of knowledge appropriate. Mood and affect appropriate.  Cranial Nerves: Pupils equal, briskly reactive to light. Extraocular movements full without nystagmus. Visual fields full to confrontation. Hearing intact. Facial sensation intact. Face, tongue, palate moves normally and symmetrically.  Motor: Normal bulk and tone. Normal strength in all tested extremity muscles Sensory.: intact to touch , pinprick , position and vibratory sensation.  Coordination: Rapid alternating movements normal in all extremities. Finger-to-nose and heel-to-shin performed accurately bilaterally. Gait and Station: Arises from chair without difficulty. Stance is normal. Gait demonstrates normal stride length and balance without use of AD. Tandem walk and heel toe without difficulty.  Reflexes: 1+ and symmetric. Toes downgoing.         ASSESSMENT/PLAN: Stuart Lucero is a 58 y.o. year old male  OSA on CPAP : Compliance report shows satisfactory usage with optimal residual AHI.  Discussed  continued nightly usage with ensuring greater than 4 hours nightly for optimal benefit and per insurance purposes.  Continue to follow with DME company for any needed supplies or CPAP related concerns     Follow up in *** or call earlier if needed   CC:  PCP: Elenore Paddy, NP    I spent *** minutes of face-to-face and non-face-to-face time with patient.  This included previsit chart review, lab review, study review, order entry, electronic health record documentation, patient education regarding diagnosis of sleep apnea with review and discussion of compliance report and answered all other questions to patient's satisfaction   Frann Rider, [facility]  [facility] [address] [place], [place] [address]  Phone [phone] Fax [phone] Note: This document was prepared with digital dictation and possible smart phrase technology. Any transcriptional errors that result from this process are unintentional.

## 2023-01-19 ENCOUNTER — Encounter: Payer: Self-pay | Admitting: Adult Health

## 2023-01-19 ENCOUNTER — Ambulatory Visit: Payer: BC Managed Care – PPO | Admitting: Adult Health

## 2023-01-19 VITALS — BP 131/71 | HR 64 | Ht 75.0 in | Wt 263.0 lb

## 2023-01-19 DIAGNOSIS — G20C Parkinsonism, unspecified: Secondary | ICD-10-CM

## 2023-01-19 DIAGNOSIS — G4733 Obstructive sleep apnea (adult) (pediatric): Secondary | ICD-10-CM

## 2023-01-19 NOTE — Patient Instructions (Addendum)
Continue Requip XL '4mg'$  nightly - please let me [place] know if this needs to be increased down the road  Continue nightly use of CPAP for sleep apnea management   You can look at the website called " [employer]'s [employer] [place]" where you will find multiple different support groups and information in regards to Parkinson's disease  Highly encouraged staying active and working out routinely    Follow-up in 6 months or call earlier if needed

## 2023-01-19 NOTE — Addendum Note (Signed)
Addended by: Frann Rider L on: 01/19/2023 04:27 PM   Modules accepted: Orders

## 2023-01-20 ENCOUNTER — Telehealth: Payer: Self-pay

## 2023-01-26 DIAGNOSIS — G4733 Obstructive sleep apnea (adult) (pediatric): Secondary | ICD-10-CM | POA: Diagnosis not present

## 2023-01-28 ENCOUNTER — Encounter: Payer: Self-pay | Admitting: Adult Health

## 2023-02-24 DIAGNOSIS — G4733 Obstructive sleep apnea (adult) (pediatric): Secondary | ICD-10-CM | POA: Diagnosis not present

## 2023-03-27 DIAGNOSIS — G4733 Obstructive sleep apnea (adult) (pediatric): Secondary | ICD-10-CM | POA: Diagnosis not present

## 2023-03-29 MED ORDER — ROPINIROLE HCL ER 6 MG PO TB24: 6.0000 mg | ORAL_TABLET | Freq: Every day | ORAL | 5 refills | Status: DC

## 2023-03-29 NOTE — Addendum Note (Signed)
Addended by: Frann Rider L on: 03/29/2023 10:24 AM   Modules accepted: Orders

## 2023-04-07 ENCOUNTER — Other Ambulatory Visit: Payer: Self-pay | Admitting: Neurology

## 2023-04-12 MED ORDER — ROPINIROLE HCL ER 4 MG PO TB24: 4.0000 mg | ORAL_TABLET | Freq: Every day | ORAL | 5 refills | Status: DC

## 2023-04-12 NOTE — Addendum Note (Signed)
Addended by: Huston Foley on: 04/12/2023 08:31 AM   Modules accepted: Orders

## 2023-05-25 ENCOUNTER — Telehealth: Payer: Self-pay | Admitting: *Deleted

## 2023-05-25 NOTE — Telephone Encounter (Signed)
Form fee due. Lvm not able to reach pt.

## 2023-05-26 DIAGNOSIS — Z0289 Encounter for other administrative examinations: Secondary | ICD-10-CM

## 2023-06-03 ENCOUNTER — Other Ambulatory Visit: Payer: Self-pay

## 2023-06-06 NOTE — Telephone Encounter (Signed)
Placed paperwork in POD 4 for Dr Frances Furbish to review and sign.

## 2023-06-06 NOTE — Telephone Encounter (Signed)
Forms signed, placed in MR for pick

## 2023-06-06 NOTE — Telephone Encounter (Signed)
Pt [employer] form faxed on 06/06/2023

## 2023-06-06 NOTE — Telephone Encounter (Signed)
Patient called to check up on FMLA paperwork. He is requesting that the paperwork be filled out broadly for intermittent leave to encompass multiple visits. He is requesting a call back for any questions.

## 2023-07-20 ENCOUNTER — Encounter: Payer: Self-pay | Admitting: Adult Health

## 2023-07-20 ENCOUNTER — Telehealth: Payer: Self-pay | Admitting: Adult Health

## 2023-07-20 ENCOUNTER — Ambulatory Visit: Payer: BC Managed Care – PPO | Admitting: Adult Health

## 2023-07-20 VITALS — BP 128/80 | HR 78 | Ht 75.0 in | Wt 264.0 lb

## 2023-07-20 DIAGNOSIS — G20A1 Parkinson's disease without dyskinesia, without mention of fluctuations: Secondary | ICD-10-CM

## 2023-07-20 MED ORDER — NEUPRO 4 MG/24HR TD PT24: 1.0000 | MEDICATED_PATCH | Freq: Every day | TRANSDERMAL | 0 refills | Status: DC

## 2023-07-20 NOTE — Progress Notes (Signed)
[facility] [address] [place]. [place] [address] [phone]       OFFICE FOLLOW UP NOTE  [name] Date of Birth:  [birth year] Medical Record Number:  [id number]   Primary neurologist: Dr. Frances Lucero Reason for visit: CPAP follow-up, Parkinsonism     SUBJECTIVE:   CHIEF COMPLAINT:  Chief Complaint  Patient presents with   Follow-up    Rm 7, here alone Pt is here for follow up on Parkinsonism. Pt states his tremors are progressing are now on both hands. States he has been drooling a little . Voice sounds crackly. States he is getting tired quicker.    Follow-up visit:  Prior visit: 01/19/2023  Brief HPI:   Stuart Lucero is a 57 year old right-handed gentleman with an underlying medical history of cardiomyopathy, allergic rhinitis, anxiety, hypertension, hyperlipidemia, and mild obesity, who presents for follow-up of his parkinsonism.  An initial visit in 05/2019 with Dr. Frances Lucero, he complained of 6 to 8 months history of LUE tremor with exam consistent with left-sided dominant parkinsonism, he was started on ropinirole. MRI brain w/wo 07/2022 normal. On 09/07/2022, Dr. Frances Lucero increased ropinirole dosage and recommended pursuing sleep study to rule out sleep apnea.  Difficulty tolerating ropinirole immediate release (fatigue, impaired cognition) therefore switched to XR with improvement.  Difficulty tolerating 6 mg dosing and maintained on 4 mg  At prior visit, reported tolerating Requip XR well with improvement of tremor. Completed HST 09/29/2022 which showed moderate OSA with total AHI of 22.2/h and recommend initiation of AutoPap.  Received AutoPap on 10/26/2022.  Initial compliance visit showed 60% compliance although optimal residual AHI with use due to difficulty tolerating mask, received new mask and tolerating better.      Interval history:  Returns today for follow-up visit.  He does report gradual decline since prior visit.  He has had some  progressive LUE tremor and now has noticed RUE tremor while ambulating, denies being present while resting. Also notes some increased stiffness of left hand.  His gait has also become slower and doesn't feel as steady.  Ambulates without AD.  He does admit to not being as active as he should be although he does continue to work.  Occasional drooling, no significant swallowing difficulties. He also notes voice being shaky.  He has remained on Requip XL 4mg  nightly, tolerating well.   Over the past several months, he has been sleeping on the couch where he feels like he sleeps better but does not feel refreshed upon awakening and has daytime fatigue.  He has not been using his CPAP since this transition.      ROS:   14 system review of systems performed and negative with exception of those listed in HPI  PMH:  Past Medical History:  Diagnosis Date   Allergic rhinitis    Cardiomyopathy due to hypertension, without heart failure (HCC)    09-2018   Diastolic dysfunction 2019   Hyperlipidemia    Hypertension     PSH:  Past Surgical History:  Procedure Laterality Date   BACK SURGERY     2014   LIPOMA EXCISION  2017    Social History:  Social History   Socioeconomic History   Marital status: Divorced    Spouse name: Not on file   Number of children: Not on file   Years of education: Not on file   Highest education level: Not on file  Occupational History   Not on file  Tobacco Use   Smoking status:  Former    Current packs/day: 0.00    Average packs/day: 0.5 packs/day for 5.0 years (2.5 ttl pk-yrs)    Types: Cigarettes    Start date: 12/27/2012    Quit date: 12/27/2017    Years since quitting: 5.5   Smokeless tobacco: Never  Vaping Use   Vaping status: Never Used  Substance and Sexual Activity   Alcohol use: Yes    Alcohol/week: 7.0 standard drinks of alcohol    Types: 7 Cans of beer per week   Drug use: Never   Sexual activity: Not on file  Other Topics Concern   Not on  file  Social History Narrative   Not on file   Social Determinants of Health   Financial Resource Strain: Not on file  Food Insecurity: Not on file  Transportation Needs: Not on file  Physical Activity: Not on file  Stress: Not on file  Social Connections: Unknown (07/20/2022)   Received from [facility], [facility]   Social Network    Social Network: Not on file  Intimate Partner Violence: Unknown (07/20/2022)   Received from [facility], [facility]   HITS    Physically Hurt: Not on file    Insult or Talk Down To: Not on file    Threaten Physical Harm: Not on file    Scream or Curse: Not on file    Family History:  Family History  Problem Relation Age of Onset   Heart disease Father    Diabetes Father    Stroke Brother    Hypertension Brother    Tremor Neg Hx    Parkinson's disease Neg Hx     Medications:   Current Outpatient Medications on File Prior to Visit  Medication Sig Dispense Refill   ALPRAZolam (XANAX) 0.25 MG tablet Take by mouth as needed for anxiety.     amLODipine (NORVASC) 5 MG tablet Take 5 mg by mouth daily.     carvedilol (COREG) 12.5 MG tablet Take by mouth.     indapamide (LOZOL) 2.5 MG tablet Take by mouth daily. 1/2 to 1 tablet daily     rOPINIRole (REQUIP XL) 4 MG 24 hr tablet Take 1 tablet (4 mg total) by mouth at bedtime. 30 tablet 5   tamsulosin (FLOMAX) 0.4 MG CAPS capsule Take 0.4 mg by mouth at bedtime.     No current facility-administered medications on file prior to visit.    Allergies:   Allergies  Allergen Reactions   Oxycodone-Acetaminophen Shortness Of Breath   Gabapentin Other (See Comments)    Makes me angry      OBJECTIVE:  Physical Exam  Vitals:   07/20/23 1515  BP: 128/80  Pulse: 78  Weight: 264 lb (119.7 kg)  Height: 6\' 3"  (1.905 m)   Body mass index is 33 kg/m. No results found.  General: well developed, well nourished, very pleasant middle-age Caucasian male, seated, in no evident  distress Head: head normocephalic and atraumatic.   Neck: supple with no carotid or supraclavicular bruits Cardiovascular: regular rate and rhythm, no murmurs Musculoskeletal: no deformity Skin:  no rash/petichiae Vascular:  Normal pulses all extremities   Neurologic Exam Mental Status: Awake and fully alert.  Speech with mild hypophonia, slight voice tremor, intermittent jaw tremor.  No evidence of dysarthria or aphasia.  Oriented to place and time. Recent and remote memory intact. Attention span, concentration and fund of knowledge appropriate. Mood and affect appropriate.  Mild to moderate facial masking noted. Cranial Nerves: Pupils equal, briskly  reactive to light. Extraocular movements full without nystagmus. Visual fields full to confrontation. Hearing intact. Facial sensation intact. Face, tongue, palate moves normally and symmetrically.  Motor: Normal strength in all tested extremity muscles, mildly increased tone of LUE and mild resting tremor of LUE.  Slight postural tremor of LUE, no intention or action tremor.  Slight RUE resting tremor. No tremor BLE. finger tapping mildly impaired R>L. Foot taps mild to moderately impaired on the left, mild on right. R>L bradykinesia Sensory.: intact to touch , pinprick , position and vibratory sensation.  Gait and Station: Arises from chair without difficulty. Stance is normal.  Ambulates slowly but adequate stride length bilaterally, decreased arm swing more so on left, BUE tremor with ambulation.  Difficulty performing tandem walk and heel toe. Reflexes: 1+ and symmetric. Toes downgoing.         ASSESSMENT/PLAN: Stuart Lucero is a 57 y.o. year old male    Parkinsonism:  some decline since prior visit.   Currently on Requip XL 4mg  nightly.  Unable to tolerate 6 mg dosing.  Further discussed with Dr. Frances Lucero - recommends stopping Requip and transitioning to Neupro patch 4mg  for 1 month then increase if needed to 6mg  and consider further  increasing to 8mg  after 1 month Referral placed to [facility] in [place] with a Parkinson's disease specialist Discussed importance of routine exercise, maintaining a healthy diet and adequate hydration  OSA on CPAP : Has been noncompliant on CPAP over the past several months.  Discussed importance of restarting CPAP as he does complain of feeling unrefreshed in the morning and daytime fatigue which is going to overall affect his Parkinson's disease symptoms.  He verbalized understanding and plans to restart.  Will follow up on this at follow up visit.      Follow up in 4 months or call earlier if needed   CC:  PCP: Julianne Handler, NP    I spent 42 minutes of face-to-face and non-face-to-face time with patient.  This included previsit chart review, lab review, study review, order entry, electronic health record documentation, patient education and discussion regarding above diagnoses and treatment plan and answered all the questions to patient's satisfaction   Ihor Austin, [facility]  [facility] [address] [place], [place] [address]  Phone [phone] Fax [phone] Note: This document was prepared with digital dictation and possible smart phrase technology. Any transcriptional errors that result from this process are unintentional.

## 2023-07-20 NOTE — Patient Instructions (Addendum)
Continue Requip XL 4mg  nightly for now  Will follow up with Dr. Frances Furbish regarding further treatment recommendations   Referral placed to [facility] with parkinsons specialist   Restart on your CPAP - daytime fatigue and poor sleep can worsen your symptoms      Follow up in 4 months or call earlier if needed

## 2023-07-20 NOTE — Telephone Encounter (Signed)
Referral for physical therapy fax to [facility]. Phone: [phone], Fax: [phone]

## 2023-08-09 MED ORDER — NEUPRO 6 MG/24HR TD PT24: 1.0000 | MEDICATED_PATCH | Freq: Every day | TRANSDERMAL | 12 refills | Status: DC

## 2023-08-16 DIAGNOSIS — Z0000 Encounter for general adult medical examination without abnormal findings: Secondary | ICD-10-CM | POA: Diagnosis not present

## 2023-08-16 DIAGNOSIS — Z131 Encounter for screening for diabetes mellitus: Secondary | ICD-10-CM | POA: Diagnosis not present

## 2023-08-16 DIAGNOSIS — Z125 Encounter for screening for malignant neoplasm of prostate: Secondary | ICD-10-CM | POA: Diagnosis not present

## 2023-08-16 DIAGNOSIS — E669 Obesity, unspecified: Secondary | ICD-10-CM | POA: Diagnosis not present

## 2023-08-18 ENCOUNTER — Telehealth: Payer: Self-pay | Admitting: Adult Health

## 2023-08-18 NOTE — Telephone Encounter (Signed)
I called and spoke to the patient. He has not started physical therapy yet but is planning to do so. He reports that he felt acutely ill last week and went to see his PCP.  He is encouraged to talk to PCP about additional FMLA as well. He is advised that he has other reasons to not feel well.  We are going to work with him on his FMLA. For now, I suggested we proceed with 2 days a week for 6 weeks to allow for additional days off for PT.  Restarting PAP therapy was also addressed by Shanda Bumps at the last visit a month ago.

## 2023-08-18 NOTE — Telephone Encounter (Signed)
Pt is asking for a call to discuss the frequencies allowed for his FMLA for his Parkinson's disease.  Pt states this is a time sensitive matter and is asking this be looked into and responded to as soon as possible.

## 2023-08-18 NOTE — Telephone Encounter (Signed)
Spoke to patient was already approved for FMLA  Pt is requesting  more days Pt is requesting 10 + days a month  for FMLA  Pt states someday's he is fatigue , and not able to go to work and needs to take the mornings off.  Pt also states he is starting PT and he needs days for that also  Pt  has requesting  a call back from Dr. Frances Furbish .

## 2023-08-24 NOTE — Telephone Encounter (Signed)
Dr Frances Furbish aware of job description from pt. FMLA form completed with updated appointments and 3 month time frame. FMLA form signed and sent to medical records for processing.

## 2023-08-26 NOTE — Telephone Encounter (Signed)
[employer] Delfino Lovett) Clarification is needed on form physician completed for FMLA. Contact info: [phone]

## 2023-08-30 NOTE — Telephone Encounter (Signed)
I called [employer] back at [phone] and spoke with Lennox Grumbles. [employer] is asking if start date can be backdated to 08/13/23 (from 08/23/23) and they also mentioned that the number of episodes monthly do not cover what days patient has called out already. Dr Frances Furbish has agreed to backdate FMLA to 08/13/23 (ending 11/22/23) but the days allotted for absences will remain the same. Lennox Grumbles noted this and thanked me for the call.

## 2023-08-30 NOTE — Telephone Encounter (Signed)
Pt has called stating there are corrections that need to be made re: his FMLA, he states this is time sensitive and that his job is at risk, please call.

## 2023-09-06 NOTE — Telephone Encounter (Signed)
Handled, per [employer]

## 2023-09-19 ENCOUNTER — Telehealth: Payer: Self-pay | Admitting: Anesthesiology

## 2023-09-19 NOTE — Telephone Encounter (Signed)
We received communication from Stuart Lucero at [facility] stating that pt had an appointment scheduled with them on 09/07/2023 and he did not show up for it.

## 2023-09-28 ENCOUNTER — Encounter: Payer: Self-pay | Admitting: Adult Health

## 2023-09-28 MED ORDER — NEUPRO 8 MG/24HR TD PT24: 8.0000 mg | MEDICATED_PATCH | Freq: Every day | TRANSDERMAL | 11 refills | Status: DC

## 2023-10-11 ENCOUNTER — Other Ambulatory Visit: Payer: Self-pay | Admitting: Neurology

## 2023-11-15 NOTE — Progress Notes (Unsigned)
[facility] [address] [place]. [place] [address] [phone]       OFFICE FOLLOW UP NOTE  Stuart Lucero Date of Birth:  [birth year] Medical Record Number:  [id number]   Primary neurologist: Dr. Frances Furbish Reason for visit: CPAP follow-up, Parkinsonism     SUBJECTIVE:   CHIEF COMPLAINT:  No chief complaint on file.  Follow-up visit:  Prior visit: 01/19/2023  Brief HPI:   Stuart Lucero is a 57 year old right-handed gentleman with an underlying medical history of cardiomyopathy, allergic rhinitis, anxiety, hypertension, hyperlipidemia, and mild obesity, who presents for follow-up of his parkinsonism.  An initial visit in 05/2019 with Dr. Frances Furbish, he complained of 6 to 8 months history of LUE tremor with exam consistent with left-sided dominant parkinsonism, he was started on ropinirole. MRI brain w/wo 07/2022 normal. On 09/07/2022, Dr. Frances Furbish increased ropinirole dosage and recommended pursuing sleep study to rule out sleep apnea.  Difficulty tolerating ropinirole immediate release (fatigue, impaired cognition) therefore switched to XR with improvement.  Difficulty tolerating 6 mg dosing and maintained on 4 mg. Completed HST 09/29/2022 which showed moderate OSA with total AHI of 22.2/h and recommend initiation of AutoPap.  Received AutoPap on 10/26/2022.   At prior visit, reported some worsening of LUE tremor and stiffness and some RUE tremor while ambulating. He was transitioned from Requip to Neupro with gradual titration to 8mg  and referral placed to PT. Noted CPAP noncompliance due to now sleeping on couch instead of bed and discussed importance of compliance.       Interval history:   [facility] ***  Continues on Neupro patch, was increased to 8mg  beginning of October ***      Returns today for follow-up visit.  He does report gradual decline since prior visit.  He has had some progressive LUE tremor and now has noticed RUE tremor while  ambulating, denies being present while resting. Also notes some increased stiffness of left hand.  His gait has also become slower and doesn't feel as steady.  Ambulates without AD.  He does admit to not being as active as he should be although he does continue to work.  Occasional drooling, no significant swallowing difficulties. He also notes voice being shaky.  He has remained on Requip XL 4mg  nightly, tolerating well.   Over the past several months, he has been sleeping on the couch where he feels like he sleeps better but does not feel refreshed upon awakening and has daytime fatigue.  He has not been using his CPAP since this transition.      ROS:   14 system review of systems performed and negative with exception of those listed in HPI  PMH:  Past Medical History:  Diagnosis Date   Allergic rhinitis    Cardiomyopathy due to hypertension, without heart failure (HCC)    09-2018   Diastolic dysfunction 2019   Hyperlipidemia    Hypertension     PSH:  Past Surgical History:  Procedure Laterality Date   BACK SURGERY     2014   LIPOMA EXCISION  2017    Social History:  Social History   Socioeconomic History   Marital status: Divorced    Spouse name: Not on file   Number of children: Not on file   Years of education: Not on file   Highest education level: Not on file  Occupational History   Not on file  Tobacco Use   Smoking status: Former    Current packs/day: 0.00  Average packs/day: 0.5 packs/day for 5.0 years (2.5 ttl pk-yrs)    Types: Cigarettes    Start date: 12/27/2012    Quit date: 12/27/2017    Years since quitting: 5.8   Smokeless tobacco: Never  Vaping Use   Vaping status: Never Used  Substance and Sexual Activity   Alcohol use: Yes    Alcohol/week: 7.0 standard drinks of alcohol    Types: 7 Cans of beer per week   Drug use: Never   Sexual activity: Not on file  Other Topics Concern   Not on file  Social History Narrative   Not on file   Social  Determinants of Health   Financial Resource Strain: Not on file  Food Insecurity: Not on file  Transportation Needs: Not on file  Physical Activity: Not on file  Stress: Not on file  Social Connections: Unknown (07/20/2022)   Received from [facility], [facility]   Social Network    Social Network: Not on file  Intimate Partner Violence: Unknown (07/20/2022)   Received from [facility], [facility]   HITS    Physically Hurt: Not on file    Insult or Talk Down To: Not on file    Threaten Physical Harm: Not on file    Scream or Curse: Not on file    Family History:  Family History  Problem Relation Age of Onset   Heart disease Father    Diabetes Father    Stroke Brother    Hypertension Brother    Tremor Neg Hx    Parkinson's disease Neg Hx     Medications:   Current Outpatient Medications on File Prior to Visit  Medication Sig Dispense Refill   ALPRAZolam (XANAX) 0.25 MG tablet Take by mouth as needed for anxiety.     amLODipine (NORVASC) 5 MG tablet Take 5 mg by mouth daily.     carvedilol (COREG) 12.5 MG tablet Take by mouth.     indapamide (LOZOL) 2.5 MG tablet Take by mouth daily. 1/2 to 1 tablet daily     Rotigotine (NEUPRO) 8 MG/24HR PT24 Place 8 mg onto the skin daily. 30 patch 11   tamsulosin (FLOMAX) 0.4 MG CAPS capsule Take 0.4 mg by mouth at bedtime.     No current facility-administered medications on file prior to visit.    Allergies:   Allergies  Allergen Reactions   Oxycodone-Acetaminophen Shortness Of Breath   Gabapentin Other (See Comments)    Makes me angry      OBJECTIVE:  Physical Exam  There were no vitals filed for this visit.  There is no height or weight on file to calculate BMI. No results found.  General: well developed, well nourished, very pleasant middle-age Caucasian male, seated, in no evident distress Head: head normocephalic and atraumatic.   Neck: supple with no carotid or supraclavicular bruits Cardiovascular:  regular rate and rhythm, no murmurs Musculoskeletal: no deformity Skin:  no rash/petichiae Vascular:  Normal pulses all extremities   Neurologic Exam Mental Status: Awake and fully alert.  Speech with mild hypophonia, slight voice tremor, intermittent jaw tremor.  No evidence of dysarthria or aphasia.  Oriented to place and time. Recent and remote memory intact. Attention span, concentration and fund of knowledge appropriate. Mood and affect appropriate.  Mild to moderate facial masking noted. Cranial Nerves: Pupils equal, briskly reactive to light. Extraocular movements full without nystagmus. Visual fields full to confrontation. Hearing intact. Facial sensation intact. Face, tongue, palate moves normally and symmetrically.  Motor:  Normal strength in all tested extremity muscles, mildly increased tone of LUE and mild resting tremor of LUE.  Slight postural tremor of LUE, no intention or action tremor.  Slight RUE resting tremor. No tremor BLE. finger tapping mildly impaired R>L. Foot taps mild to moderately impaired on the left, mild on right. R>L bradykinesia Sensory.: intact to touch , pinprick , position and vibratory sensation.  Gait and Station: Arises from chair without difficulty. Stance is normal.  Ambulates slowly but adequate stride length bilaterally, decreased arm swing more so on left, BUE tremor with ambulation.  Difficulty performing tandem walk and heel toe. Reflexes: 1+ and symmetric. Toes downgoing.         ASSESSMENT/PLAN: Stuart Lucero is a 57 y.o. year old male    Parkinsonism:  some decline since prior visit.   Continue  Further discussed with Dr. Frances Furbish - recommends stopping Requip and transitioning to Neupro patch 4mg  for 1 month then increase if needed to 6mg  and consider further increasing to 8mg  after 1 month Referral placed to [facility] in [place] with a Parkinson's disease specialist Discussed importance of routine exercise, maintaining a healthy diet  and adequate hydration  OSA on CPAP : Has been noncompliant on CPAP over the past several months.  Discussed importance of restarting CPAP as he does complain of feeling unrefreshed in the morning and daytime fatigue which is going to overall affect his Parkinson's disease symptoms.  He verbalized understanding and plans to restart.  Will follow up on this at follow up visit.      Follow up in 4 months or call earlier if needed   CC:  PCP: Julianne Handler, NP    I spent 42 minutes of face-to-face and non-face-to-face time with patient.  This included previsit chart review, lab review, study review, order entry, electronic health record documentation, patient education and discussion regarding above diagnoses and treatment plan and answered all the questions to patient's satisfaction   Ihor Austin, [facility]  [facility] [address] [place], [place] [address]  Phone [phone] Fax [phone] Note: This document was prepared with digital dictation and possible smart phrase technology. Any transcriptional errors that result from this process are unintentional.

## 2023-11-16 ENCOUNTER — Encounter: Payer: Self-pay | Admitting: Adult Health

## 2023-11-16 ENCOUNTER — Ambulatory Visit: Payer: BC Managed Care – PPO | Admitting: Adult Health

## 2023-11-16 VITALS — BP 148/70 | HR 67 | Ht 75.0 in | Wt 265.8 lb

## 2023-11-16 DIAGNOSIS — G20A2 Parkinson's disease without dyskinesia, with fluctuations: Secondary | ICD-10-CM

## 2023-11-16 MED ORDER — RASAGILINE MESYLATE 1 MG PO TABS: 1.0000 mg | ORAL_TABLET | Freq: Every day | ORAL | 11 refills | Status: DC

## 2023-11-16 NOTE — Patient Instructions (Signed)
Continue Neupro patch 8mg  daily - please ensure you remove the patch and immediately place the other patch on in a different location. Ensure you remove your old patch prior to placing new patch  Start Azilect 1 mg daily - please call with any difficulty tolerating   Increase routine physical activity and exercise as well as ensuring you are maintaining a healthy diet  If you are interested in doing physical therapy, please let me know      Follow up in 6 months or call earlier if needed

## 2023-11-23 ENCOUNTER — Telehealth: Payer: Self-pay | Admitting: Adult Health

## 2023-11-23 NOTE — Telephone Encounter (Signed)
Pt called wanting to know what the update is on his FMLA paperwork. Please advise.

## 2023-11-28 NOTE — Telephone Encounter (Signed)
Pt call to made aware [employer] will be faxing over paperwork some time today putting attention to Shanda Bumps, NP. Have already paid the fee. The original paperwork needed to be updated.

## 2023-12-06 ENCOUNTER — Telehealth: Payer: Self-pay | Admitting: *Deleted

## 2023-12-06 NOTE — Telephone Encounter (Signed)
Pt [employer] form in [place]

## 2023-12-06 NOTE — Telephone Encounter (Addendum)
Pt called in regards to [employer] paperwork intermittent FMLA. Would like a call back on the status of the paperwork. [employer] has to have form in by 12/13/23. Please call back after 3:30 pm

## 2023-12-07 NOTE — Telephone Encounter (Signed)
Spoke to patient made him aware that will fill out FMLA form today and will send to his employer this week. Pt expressed understanding and thanked me for calling

## 2023-12-07 NOTE — Telephone Encounter (Signed)
Pt calling to check status of FMLA paperwork. Requesting call back.

## 2023-12-07 NOTE — Telephone Encounter (Signed)
Completed form provider signed placed in Medical record box

## 2023-12-09 NOTE — Telephone Encounter (Signed)
Stuart Lucero) Previous leave ended on the 11/22/23. Start date need for new paper work to be 11/23/23. Need to pick up where left off.  Can call and update verbally to [phone] or can update form and fax back.

## 2023-12-12 ENCOUNTER — Telehealth: Payer: Self-pay | Admitting: *Deleted

## 2023-12-12 NOTE — Telephone Encounter (Signed)
Pt states information is missing on paperwork. Requesting call back or information to be fixed being that tomorrow is his return deadline.

## 2023-12-12 NOTE — Telephone Encounter (Signed)
Pt form refax today.

## 2023-12-12 NOTE — Telephone Encounter (Signed)
Changed date on form and gave to Stanton Kidney in medical records

## 2023-12-12 NOTE — Telephone Encounter (Signed)
Pt checking status. Advised it was re faxed per note

## 2023-12-12 NOTE — Telephone Encounter (Signed)
Noted! Thank you

## 2023-12-25 ENCOUNTER — Other Ambulatory Visit: Payer: Self-pay | Admitting: Adult Health

## 2024-01-11 ENCOUNTER — Other Ambulatory Visit: Payer: Self-pay | Admitting: Adult Health

## 2024-01-12 ENCOUNTER — Other Ambulatory Visit: Payer: Self-pay | Admitting: *Deleted

## 2024-01-12 NOTE — Telephone Encounter (Signed)
Last seen on 11/16/23 Follow up scheduled on 06/04/24

## 2024-02-06 DIAGNOSIS — H01024 Squamous blepharitis left upper eyelid: Secondary | ICD-10-CM | POA: Diagnosis not present

## 2024-02-06 DIAGNOSIS — Z6834 Body mass index (BMI) 34.0-34.9, adult: Secondary | ICD-10-CM | POA: Diagnosis not present

## 2024-02-22 ENCOUNTER — Telehealth: Payer: Self-pay | Admitting: Adult Health

## 2024-02-22 MED ORDER — CARBIDOPA-LEVODOPA 25-100 MG PO TABS
0.5000 | ORAL_TABLET | Freq: Three times a day (TID) | ORAL | 11 refills | Status: DC
Start: 2024-02-22 — End: 2024-03-28

## 2024-02-22 NOTE — Telephone Encounter (Signed)
 Left detailed message on voicemail that new Rx has been sent to the pharmacy and to call if any questions.

## 2024-02-22 NOTE — Telephone Encounter (Signed)
 Recommend starting carbidopa levodopa 25/100 mg 0.5 tab TID.  Potential side effects include dyskinesias, nausea and orthostatic hypotension. If any difficulty tolerating, please let us know.

## 2024-02-22 NOTE — Telephone Encounter (Signed)
 Pt said took one dose in January and stop taking rasagiline (AZILECT) 1 MG TABS tablet because make head feel buzzy. Would like a call from the nurse to discuss trying low dosage of carbidopa levodopa. Starting to have while at work struggling to get through the day; walking stiff, walking slow.  Patient asking to call after 3:30 pm or between 12pm and 1pm

## 2024-02-27 ENCOUNTER — Telehealth: Payer: Self-pay | Admitting: Adult Health

## 2024-02-27 NOTE — Telephone Encounter (Signed)
 Pt called and stated that he has started  his carbidopa-levodopa (SINEMET IR) 25-100 MG tablet Sat 02/25/24 and he would like to know if he is to stop the Rotigotine (NEUPRO) 8 MG/24HR PT24 and how does he go about it. Please advise.

## 2024-02-28 DIAGNOSIS — Z6835 Body mass index (BMI) 35.0-35.9, adult: Secondary | ICD-10-CM | POA: Diagnosis not present

## 2024-02-28 DIAGNOSIS — R600 Localized edema: Secondary | ICD-10-CM | POA: Diagnosis not present

## 2024-02-28 NOTE — Telephone Encounter (Signed)
 Contacted pt back, LVM per DPR, informing him NP recommends he remain on both Sinemet and Neupro. Will also send [facility] msg.

## 2024-02-28 NOTE — Telephone Encounter (Signed)
 Please advise him to remain on both Sinemet and Neupro. Thank you.

## 2024-02-29 MED ORDER — NEUPRO 4 MG/24HR TD PT24: 1.0000 | MEDICATED_PATCH | Freq: Every day | TRANSDERMAL | 0 refills | Status: DC

## 2024-02-29 MED ORDER — NEUPRO 6 MG/24HR TD PT24: 1.0000 | MEDICATED_PATCH | Freq: Every day | TRANSDERMAL | 0 refills | Status: DC

## 2024-02-29 MED ORDER — NEUPRO 2 MG/24HR TD PT24: 1.0000 | MEDICATED_PATCH | Freq: Every day | TRANSDERMAL | 0 refills | Status: DC

## 2024-02-29 NOTE — Addendum Note (Signed)
 Addended by: Ihor Austin L on: 02/29/2024 01:40 PM   Modules accepted: Orders

## 2024-02-29 NOTE — Addendum Note (Signed)
 Addended by: Ihor Austin L on: 02/29/2024 02:54 PM   Modules accepted: Orders

## 2024-03-27 NOTE — Telephone Encounter (Signed)
 Please call patient to see what he needs. Thank you.

## 2024-03-28 MED ORDER — CARBIDOPA-LEVODOPA 25-100 MG PO TABS: 1.0000 | ORAL_TABLET | Freq: Three times a day (TID) | ORAL | 11 refills | Status: DC

## 2024-03-28 NOTE — Addendum Note (Signed)
 Addended by: Ihor Austin L on: 03/28/2024 11:46 AM   Modules accepted: Orders

## 2024-03-28 NOTE — Telephone Encounter (Signed)
 Can increase Sinemet to 1 tab 3 times daily.  Would recommend he continue on this dose until follow-up visit in June.

## 2024-03-28 NOTE — Telephone Encounter (Signed)
 I contacted pt back, he stated he wanted to speak to NP per his req on [facility]. He is having concerns with Carbi-Levo.   He is moving slow, speaking slow, stiffness in legs in the last two weeks, symptoms have progressed. His legs feels like they want to lock up.  He has been off Neupro patch since Friday 3/28. He felt benefit from [facility]- Levo when he first started it but not as much any more.

## 2024-03-28 NOTE — Telephone Encounter (Signed)
 Contacted pt back, informed him of NP recommendations to increase Sinemet to 1 tab 3 times daily.  Would recommend he continue on this dose until follow-up visit in June. Pt verbally understood and was appreciative.

## 2024-04-03 ENCOUNTER — Telehealth: Payer: Self-pay | Admitting: Adult Health

## 2024-04-03 ENCOUNTER — Encounter: Payer: Self-pay | Admitting: Adult Health

## 2024-04-03 NOTE — Telephone Encounter (Signed)
 Spoke w/Pt regarding new symptoms. Pt also sent detailed [facility] message stating new symptoms. Pt stated he has had a gradual progression of symptoms that the urinary and bladder difficulty started last week and he missed work all last week due to slow movements and tremors being so bad. 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 Stated when he works usually has to take a 5 to 10 min nap to make it to the end of his 8 hour work day. Feels he may no longer be able to perform his job duties. Is concerned about how many days of FMLA he has used and wondering if he needs to take a short term disability leave or is it time to stop work all together. Informed Pt will send to provider for review.

## 2024-04-03 NOTE — Telephone Encounter (Signed)
 Please see message from patient.  Do you want to see him in the office to further discuss this?

## 2024-04-03 NOTE — Telephone Encounter (Signed)
 Pt called wanting to speak to the provider regarding new symptoms he has been experiencing with his parkinson's. Please advise.

## 2024-04-03 NOTE — Telephone Encounter (Signed)
 Can patient be contacted to schedule follow-up visit with Dr. Frances Furbish at her next available to further discuss these concerns?  In the meantime, it is recommended that he also further discuss short-term disability with his PCP as recommended by Dr. Frances Furbish.  Thank you!

## 2024-04-04 DIAGNOSIS — F0631 Mood disorder due to known physiological condition with depressive features: Secondary | ICD-10-CM | POA: Diagnosis not present

## 2024-04-04 DIAGNOSIS — E669 Obesity, unspecified: Secondary | ICD-10-CM | POA: Diagnosis not present

## 2024-04-04 DIAGNOSIS — G20B2 Parkinson's disease with dyskinesia, with fluctuations: Secondary | ICD-10-CM | POA: Diagnosis not present

## 2024-04-04 DIAGNOSIS — Z6833 Body mass index (BMI) 33.0-33.9, adult: Secondary | ICD-10-CM | POA: Diagnosis not present

## 2024-04-04 NOTE — Telephone Encounter (Signed)
 Please see patients response to recent MyChart.

## 2024-04-04 NOTE — Telephone Encounter (Signed)
 Pt is asking to be called to discuss him getting in to see Dr Frances Furbish, he states this is a time sensitive situation that could affect his job.  Pt made aware the Rn's will review and respond when they are able to do so.

## 2024-04-05 ENCOUNTER — Encounter: Payer: Self-pay | Admitting: Neurology

## 2024-04-18 NOTE — Progress Notes (Signed)
 Assessment/Plan:   1.  Parkinsons Disease   -We discussed the diagnosis as well as pathophysiology of the disease.  We discussed treatment options as well as prognostic indicators.  Patient education was provided.  -We discussed that it used to be thought that levodopa  would increase risk of melanoma but now it is believed that Parkinsons itself likely increases risk of melanoma. he is to get regular skin checks.  He was given names of dermatologist.  -Greater than 50% of the 60 minute visit was spent in counseling answering questions and talking about what to expect now as well as in the future.  We talked about medication options as well as potential future surgical options.  We talked about safety in the home.  - I am going to gradually increase his medication so that he takes carbidopa /levodopa  25/100, 2 tablets at 7 AM/11 AM/4 PM.  Titration schedule given.  -I will refer the patient for PT/OT and ST.  We talked about the importance of safe, cardiovascular exercise in Parkinson's disease.  -We discussed community resources in the area including patient support groups and community exercise programs for PD and pt education was provided to the patient.   - We talked about the importance of regular daily schedule.  - He asked me about work.  He is currently on FMLA.  While we will certainly support him in this, I did tell him that we do not fill out forms on a patient's first visit.  We would also expect that he would follow the plan laid out for him (multidisciplinary care), which he was agreeable to.  2.  GAD/depression  - He is to call me and let me know what antidepressant he was started on recently by his primary care physician.  I agree that this would be very beneficial for the patient.    - Talked about the importance of counseling.  Patient reports that he was in counseling for several years and has a good counselor that he could use and was agreeable that he would establish back with  this counselor before he saw me next visit.   3.  Sleep apnea  - no longer wearing the cpap  - Apnea hypopnea index of 22  - Understands morbidity and mortality associated with untreated sleep apnea.  Subjective:   Stuart Lucero was seen today in the movement disorders clinic for neurologic consultation at the request of [address], Renford Cartwright, *.  The consultation is for the evaluation of Parkinsons disease.  Patient is currently a patient of [facility].  Patient was first seen by Dr. Omar Bibber in June, 2023.  His first symptom started with left hand and arm shaking as well as lower lip and jaw tremor.  This started in early 2023 to late 2022.  Dr. Omar Bibber started him on ropinirole  in June, 2023.  She also recommended a sleep study.  This was done October, 2023 with apnea hypopnea index of 22.  Patient was started on CPAP.  In July, 2024 patient was switched from Requip  XL to rotigotine  patch because he was unable to tolerate higher dosages of Requip  XL beyond 4 mg.  He was started on rotigotine  6 mg.  He followed up November, 2024 and complained about fatigue, tremor, stiffness and dizziness.  His rotigotine  patch was increased to 8 mg.  Rasagiline  was also added that visit.  This was the last visit he had with [facility] neurology.  The rasagiline  was stopped over the phone in February, 2025  because of dizziness (he reports he only took it one time and may not have given it enough time).  Levodopa  was started at the patient's request that same day.  He is currently on 1 tablet tid.  He is no longer on rotigitine due to cost and he didn't think it was helping.  That being said, when he went off he realized it was helping but may have been causing compulsive eating.    Current movement medications: carbidopa /levodopa  25/100, 1 po tid (usually 6am/11am/5pm in the past but now that not working he is taking it at 9am/1pm/4pm)  Specific Symptoms:  Tremor: Yes.  , started in the L hand and jaw; now R  hand and tongue; some tremor in both legs Family hx of similar:  No. Voice: weaker/softer Sleep: trouble sleeping (he thinks some of this is related to lack of exercise in the day)  Vivid Dreams:  "not a lot but some"  Acting out dreams:  No. Wet Pillows: Yes.   Postural symptoms:  Yes.    Falls?  No. Bradykinesia symptoms: shuffling gait, slow movements, and difficulty getting out of a chair Loss of smell:  No. Loss of taste:  No. Urinary Incontinence:  some urgency Difficulty Swallowing:  No. Handwriting, micrographia: Yes.  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   And anxiety Memory changes:  Yes.  , naming trouble; bills are on auto pay; does his own pillbox every week and then able to take them Hallucinations:  No.  visual distortions: occasionally N/V:  No. Lightheaded:  No.  Syncope: No. Diplopia:  No. Dyskinesia:  No. Prior exposure to reglan/antipsychotics: No.  Neuroimaging of the brain has  previously been performed.  It is not available for my review today.  It was done at [facility] but was reported to be normal (done in 2023)  PREVIOUS MEDICATIONS:  Requip  XL (unable to tolerate above 4 mg); rasagiline ; rotigotine  (compulsive eating); levodopa   ALLERGIES:   Allergies  Allergen Reactions   Oxycodone-Acetaminophen Shortness Of Breath   Gabapentin Other (See Comments)    Makes me angry    CURRENT MEDICATIONS:  Current Meds  Medication Sig   ALPRAZolam (XANAX) 0.25 MG tablet Take by mouth as needed for anxiety.   amLODipine (NORVASC) 5 MG tablet Take 5 mg by mouth daily.   carbidopa -levodopa  (SINEMET  IR) 25-100 MG tablet Take 1 tablet by mouth 3 (three) times daily. (Patient taking differently: Take 0.5 tablets by mouth 3  (three) times daily.)   carvedilol (COREG) 12.5 MG tablet Take by mouth.   indapamide (LOZOL) 2.5 MG tablet Take by mouth daily. 1/2 to 1 tablet daily   tamsulosin (FLOMAX) 0.4 MG CAPS capsule Take 0.4 mg by mouth at bedtime.     Objective:   VITALS:   Vitals:   04/23/24 1011  BP: 124/86  Pulse: 88  SpO2: 96%  Weight: 269 lb 6.4 oz (122.2 kg)  Height: 6\' 3"  (1.905 m)    GEN:  The patient appears stated age and is in NAD. HEENT:  Normocephalic, atraumatic.  The mucous membranes are moist. The superficial temporal arteries are without ropiness or tenderness. CV:  RRR Lungs:  CTAB Neck/HEME:  There are no carotid  bruits bilaterally.  Neurological examination:  Orientation: The patient is alert and oriented x3.  Cranial nerves: There is good facial symmetry. There is facial hypomimia.  Extraocular muscles are intact. The visual fields are full to confrontational testing. The speech is fluent and clear. Soft palate rises symmetrically and there is no tongue deviation. Hearing is intact to conversational tone. Sensation: Sensation is intact to light and pinprick throughout (facial, trunk, extremities). Vibration is intact at the bilateral big toe. There is no extinction with double simultaneous stimulation. There is no sensory dermatomal level identified. Motor: Strength is 5/5 in the bilateral upper and lower extremities.   Shoulder shrug is equal and symmetric.  There is no pronator drift. Deep tendon reflexes: Deep tendon reflexes are 2/4 at the bilateral biceps, triceps, brachioradialis, patella and achilles. Plantar responses are downgoing bilaterally.  Movement examination: Tone: There is mod to severe increased tone in the L>RUE Abnormal movements: there is mild to mod LUE>RUE rest tremor that increases with distraction Coordination:  There is mod to severe decremation with RAM's, with any form of RAMS, including alternating supination and pronation of the forearm, hand opening  and closing, finger taps, heel taps and toe taps bilaterally Gait and Station: The patient pushes off to arise.   The patient's stride length is good.  He is forward flexed with mild drag of the L leg and decreased arm swing bilaterally.     Total time spent on today's visit was 60 minutes, including both face-to-face time and nonface-to-face time.  Time included that spent on review of records (prior notes available to me/labs/imaging if pertinent), discussing treatment and goals, answering patient's questions and coordinating care.  Cc:  Street, Renford Cartwright, MD

## 2024-04-23 ENCOUNTER — Ambulatory Visit: Admitting: Neurology

## 2024-04-23 ENCOUNTER — Encounter: Payer: Self-pay | Admitting: Neurology

## 2024-04-23 VITALS — BP 124/86 | HR 88 | Ht 75.0 in | Wt 269.4 lb

## 2024-04-23 DIAGNOSIS — F411 Generalized anxiety disorder: Secondary | ICD-10-CM | POA: Diagnosis not present

## 2024-04-23 DIAGNOSIS — R498 Other voice and resonance disorders: Secondary | ICD-10-CM

## 2024-04-23 DIAGNOSIS — G20A1 Parkinson's disease without dyskinesia, without mention of fluctuations: Secondary | ICD-10-CM | POA: Diagnosis not present

## 2024-04-23 DIAGNOSIS — G4733 Obstructive sleep apnea (adult) (pediatric): Secondary | ICD-10-CM | POA: Diagnosis not present

## 2024-04-23 MED ORDER — CARBIDOPA-LEVODOPA 25-100 MG PO TABS: 2.0000 | ORAL_TABLET | Freq: Three times a day (TID) | ORAL | 1 refills | Status: DC

## 2024-04-23 NOTE — Patient Instructions (Addendum)
 Increase carbidopa /levodopa  25/100 to 1 tablet at 7am, 1 at 11am, 2 at 4 pm x 1 week then 1 at 7am, 2 at 11am, 2 at 4pm x 1 week, then 2 tablets at 7am/11am/4pm thereafter  Get back to counseling I will send orders for PT/OT/ST Get me the name of the antidepressant you are on  As we discussed, it used to be thought that levodopa  would increase risk of melanoma but now it is believed that Parkinsons itself likely increases risk of melanoma. I recommend yearly skin checks with a board certified dermatologist.    [facility] Locations: [facility] [address] [place], [place] [address] [phone]  [facility] Address: [address] [place], [place], [place] [address] Phone: [phone]  [facility] [address] [place], [place], [place] Phone: [phone]  [facility] [address], [place], [place] [address] Phone: [phone]  [place]: [facility] [address], [place], [place] [address] Phone: [phone]  [facility] [address] [place], [place], [place] [address] Phone: [phone]  [place]: [facility] [address], [place], [place] [address] Phone: [phone]

## 2024-04-26 NOTE — Telephone Encounter (Signed)
 Dr. Omar Bibber - FYI, now seeing Dr. Mellody Sprout or Dellar Fenton, can you please cancel any follow up visits patient has with us . Thank you.

## 2024-05-02 ENCOUNTER — Encounter: Payer: Self-pay | Admitting: Neurology

## 2024-05-02 ENCOUNTER — Telehealth: Payer: Self-pay | Admitting: Neurology

## 2024-05-02 NOTE — Telephone Encounter (Signed)
 Patient left a VM wanting to speak to someone about his  FMLA paperwork that is due on 06-20-24. He knows that Dr Tat is out of the office until 05-07-24.   He also sent a mychart message about this as well

## 2024-05-28 DIAGNOSIS — N401 Enlarged prostate with lower urinary tract symptoms: Secondary | ICD-10-CM | POA: Diagnosis not present

## 2024-05-28 DIAGNOSIS — R351 Nocturia: Secondary | ICD-10-CM | POA: Diagnosis not present

## 2024-05-28 DIAGNOSIS — R350 Frequency of micturition: Secondary | ICD-10-CM | POA: Diagnosis not present

## 2024-06-04 ENCOUNTER — Ambulatory Visit: Payer: BC Managed Care – PPO | Admitting: Adult Health

## 2024-07-02 DIAGNOSIS — F430 Acute stress reaction: Secondary | ICD-10-CM | POA: Diagnosis not present

## 2024-07-02 DIAGNOSIS — F411 Generalized anxiety disorder: Secondary | ICD-10-CM | POA: Diagnosis not present

## 2024-07-02 DIAGNOSIS — G4733 Obstructive sleep apnea (adult) (pediatric): Secondary | ICD-10-CM | POA: Diagnosis not present

## 2024-07-02 DIAGNOSIS — G20B2 Parkinson's disease with dyskinesia, with fluctuations: Secondary | ICD-10-CM | POA: Diagnosis not present

## 2024-08-03 NOTE — Progress Notes (Signed)
 Assessment/Plan:   1. Parkinsons Disease              -We discussed that it used to be thought that levodopa  would increase risk of melanoma but now it is believed that Parkinsons itself likely increases risk of melanoma. he is to get regular skin checks.  He was given names of dermatologist.  He and I discussed this today             - Discussed today whether or not we should increase his levodopa  or go ahead and try pramipexole .  He decided to continue carbidopa /levodopa  25/100, 2 tablets at 7 AM/11 AM/4 PM.    - We will cautiously start pramipexole  0.5 mg three times per day.  Titration schedule given.  We discussed risk, benefits, side effects.  He has tried other agonist in the past and had some compulsive eating and we discussed this today.             - We talked about the importance of regular daily schedule.  I talked to him about making sure that he is staying awake during the day, even if he does not sleep well at night.  - He asked about DBS therapy.  He certainly may be a good candidate in the future, but his medicines need refined first.  He is still moderately rigid.  In addition, his mood issues would need to be improved before we consider DBS therapy.   2.  GAD/depression             - Primary care managing with venlafaxine             - Talked about the importance of counseling.  Patient previously told me he was going to go to a counselor that he used for marriage counseling.  However, he was not able to get in.  Patient could benefit from a counseling relationship to deal with issues surrounding having a chronic disease, but also issues with the dissolving of the marriage and the fact that his kids do not interact with him.  I am going to refer him [name] told him he needed to start going.  - I would like to see the patient off of the Xanax.     3.  Sleep apnea with insomnia             - no longer wearing the cpap             - Apnea hypopnea index of 22  -refer to [name]  Sleep medicine.  I am hopeful that if they help him get used to the CPAP then he will get more restful sleep at night.  In addition, he is having some day night reversal.   Subjective:   Stuart Lucero was seen today in follow up for Parkinsons disease.  My previous records were reviewed prior to todays visit as well as outside records available to me.  I increased his levodopa  last visit and he reports today that this helped but he thinks that the issue is he isn't sleeping well at night.  He has tried to use the cpap but can't fall asleep with it.  Pcp told him to trial melatonin but he hasn't done that yet.  pt denies falls.  Pt denies lightheadedness, near syncope.  No hallucinations.  Last visit, we discussed reestablishing care with his counselor and he states his counselor couldn't fit him in.  He was referred to PT but he  states that they didn't call him.  He was referred to a different place for occupational/speech therapy, and he also states that he did not hear from them.  He has applied for SS disability and he isn't working right now.  He isn't exercising since not sleeping and hasn't been.  He has day/night reversal.   He is trying to deal with his Parkinson's by posting TikTok posts and helping to advocate for others with Parkinson's as well as himself.  Current prescribed movement disorder medications: carbidopa /levodopa  25/100, 2 tablets at 7 AM/11 AM/4 PM (increased)   PREVIOUS MEDICATIONS:  Requip  XL (unable to tolerate above 4 mg); rasagiline ; rotigotine  (compulsive eating); levodopa    ALLERGIES:   Allergies  Allergen Reactions   Oxycodone-Acetaminophen Shortness Of Breath   Gabapentin Other (See Comments)    Makes me angry    CURRENT MEDICATIONS:  Current Meds  Medication Sig   ALPRAZolam (XANAX) 0.25 MG tablet Take by mouth as needed for anxiety.   amLODipine (NORVASC) 5 MG tablet Take 5 mg by mouth daily.   carbidopa -levodopa  (SINEMET  IR) 25-100 MG tablet Take 2  tablets by mouth 3 (three) times daily. 7am/11am/4pm   carvedilol (COREG) 12.5 MG tablet Take by mouth.   indapamide (LOZOL) 2.5 MG tablet Take by mouth daily. 1/2 to 1 tablet daily   tamsulosin (FLOMAX) 0.4 MG CAPS capsule Take 0.4 mg by mouth at bedtime.   venlafaxine XR (EFFEXOR-XR) 37.5 MG 24 hr capsule Take 37.5 mg by mouth daily as needed.     Objective:   PHYSICAL EXAMINATION:    VITALS:   Vitals:   08/06/24 1352  BP: 136/84  Pulse: 98  Resp: 20  SpO2: 100%  Weight: 264 lb (119.7 kg)  Height: 6' 3 (1.905 m)    GEN:  The patient appears stated age and is in NAD. HEENT:  Normocephalic, atraumatic.  The mucous membranes are moist. The superficial temporal arteries are without ropiness or tenderness. CV:  RRR Lungs:  CTAB Neck/HEME:  There are no carotid bruits bilaterally.  Neurological examination:  Orientation: The patient is alert and oriented x3. Cranial nerves: There is good facial symmetry with facial hypomimia. The speech is fluent and clear. Soft palate rises symmetrically and there is no tongue deviation. Hearing is intact to conversational tone. Sensation: Sensation is intact to light touch throughout Motor: Strength is at least antigravity x4.  Movement examination: Tone: There is mod increased tone in the RUE/LUE Abnormal movements: there is mild bilateral UE rest tremor, L>R Coordination:  There is mod to severe decremation with RAM's, with any form of RAMS, including alternating supination and pronation of the forearm, hand opening and closing, finger taps, heel taps and toe taps bilaterally Gait and Station: The patient pushes off to arise.   The patient's stride length is short/decreased.  He has RUE tremor with ambulation and decreased arm swing bilaterally.      Total time spent on today's visit was 64 minutes, including both face-to-face time and nonface-to-face time.  Time included that spent on review of records (prior notes available to  me/labs/imaging if pertinent), discussing treatment and goals, answering patient's questions and coordinating care.  Cc:  Street, [name], MD

## 2024-08-06 ENCOUNTER — Encounter: Payer: Self-pay | Admitting: Neurology

## 2024-08-06 ENCOUNTER — Ambulatory Visit: Admitting: Neurology

## 2024-08-06 VITALS — BP 136/84 | HR 98 | Resp 20 | Ht 75.0 in | Wt 264.0 lb

## 2024-08-06 DIAGNOSIS — F411 Generalized anxiety disorder: Secondary | ICD-10-CM | POA: Diagnosis not present

## 2024-08-06 DIAGNOSIS — G479 Sleep disorder, unspecified: Secondary | ICD-10-CM | POA: Diagnosis not present

## 2024-08-06 DIAGNOSIS — G4733 Obstructive sleep apnea (adult) (pediatric): Secondary | ICD-10-CM | POA: Diagnosis not present

## 2024-08-06 DIAGNOSIS — R5383 Other fatigue: Secondary | ICD-10-CM

## 2024-08-06 DIAGNOSIS — G20A1 Parkinson's disease without dyskinesia, without mention of fluctuations: Secondary | ICD-10-CM

## 2024-08-06 MED ORDER — PRAMIPEXOLE DIHYDROCHLORIDE 0.125 MG PO TABS: ORAL_TABLET | ORAL | 0 refills | Status: AC

## 2024-08-06 MED ORDER — PRAMIPEXOLE DIHYDROCHLORIDE 0.5 MG PO TABS
0.5000 mg | ORAL_TABLET | Freq: Three times a day (TID) | ORAL | 1 refills | Status: AC
Start: 2024-08-06 — End: ?

## 2024-08-06 MED ORDER — CARBIDOPA-LEVODOPA 25-100 MG PO TABS: 2.0000 | ORAL_TABLET | Freq: Three times a day (TID) | ORAL | 1 refills | Status: DC

## 2024-08-06 NOTE — Patient Instructions (Addendum)
 continue carbidopa /levodopa  25/100, 2 tablets at 7am/11am/4pm  Start mirapex  (pramipexole ) as follows:  0.125 mg - 1 tablet three times per day for a week, then 2 tablets three times per day for a week, then 3 tablets three times per day for a week and then fill the 0.5 mg tablet and take 1 of those three times per day   I sent a referral to counseling.    I will send a referral to PT again.    I will send a referral to sleep medicine  Look at the facebook group parkinsons fight club.  As we discussed, it used to be thought that levodopa  would increase risk of melanoma but now it is believed that Parkinsons itself likely increases risk of melanoma. I recommend yearly skin checks with a board certified dermatologist.    [facility] Locations: [facility] [address] [place], [place] [address] [phone]  [facility] Address: [address] [place], [place], [place] [address] Phone: [phone]  [facility] [address] [place], [place], [place] Phone: [phone]  [facility] [address], [place], [place] [address] Phone: [phone]  [place]: [facility] [address] [place], [place] [address] Phone: [phone]  [facility] [address] [place], [place], [place] [address] Phone: [phone]  : [facility] [address] [place], [place] [address] Phone: [phone]   SAVE THE DATE!  We are planning a Parkinsons Disease educational symposium at [facility], [address] [place], [place], [place] [address] on September 19.  We will have a movement disorder physician expert from [employer] coming to speak and a caregiver speaker.  We will have a panel of experts that will show you who you may need on your team of people on your journey with Parkinsons.  I hope to see you there!  Use this QR code to register by  scanning it with the camera app on your phone:      Need more help with registration?  Contact [email or web address]

## 2024-08-26 ENCOUNTER — Other Ambulatory Visit: Payer: Self-pay | Admitting: Neurology

## 2024-09-12 DIAGNOSIS — G4700 Insomnia, unspecified: Secondary | ICD-10-CM | POA: Diagnosis not present

## 2024-09-12 DIAGNOSIS — G20A1 Parkinson's disease without dyskinesia, without mention of fluctuations: Secondary | ICD-10-CM | POA: Diagnosis not present

## 2024-09-12 DIAGNOSIS — G4733 Obstructive sleep apnea (adult) (pediatric): Secondary | ICD-10-CM | POA: Diagnosis not present

## 2024-10-29 ENCOUNTER — Encounter: Payer: Self-pay | Admitting: Neurology

## 2024-11-01 DIAGNOSIS — J40 Bronchitis, not specified as acute or chronic: Secondary | ICD-10-CM | POA: Diagnosis not present

## 2024-11-01 DIAGNOSIS — G20B2 Parkinson's disease with dyskinesia, with fluctuations: Secondary | ICD-10-CM | POA: Diagnosis not present

## 2024-11-01 DIAGNOSIS — R600 Localized edema: Secondary | ICD-10-CM | POA: Diagnosis not present

## 2024-11-01 DIAGNOSIS — J329 Chronic sinusitis, unspecified: Secondary | ICD-10-CM | POA: Diagnosis not present

## 2024-11-28 NOTE — Progress Notes (Deleted)
 Assessment/Plan:  Assessment and Plan Assessment & Plan       1.  Parkinsons Disease              -We discussed that it used to be thought that levodopa  would increase risk of melanoma but now it is believed that Parkinsons itself likely increases risk of melanoma. he is to get regular skin checks.  He was given names of dermatologist.  He and I discussed this today             - Discussed today whether or not we should increase his levodopa  or go ahead and try pramipexole .  He decided to continue carbidopa /levodopa  25/100, 2 tablets at 7 AM/11 AM/4 PM.               - He asked about DBS therapy.  He certainly may be a good candidate in the future, but his medicines need refined first.  He is still moderately rigid.  In addition, his mood issues would need to be improved before we consider DBS therapy.   2.  GAD/depression             - Primary care managing with venlafaxine             - Talked about the importance of counseling.  Patient previously told me he was going to go to a counselor that he used for marriage counseling.  However, he was not able to get in.  Patient could benefit from a counseling relationship to deal with issues surrounding having a chronic disease, but also issues with the dissolving of the marriage and the fact that his kids do not interact with him.  I sent a referral for counseling and they contacted him but patient did not answer the voicemail or MyChart messages.             - I would like to see the patient off of the Xanax.     3.  Sleep apnea with insomnia             - no longer wearing the cpap             - Apnea hypopnea index of 22             -***refer to [facility].  I am hopeful that if they help him get used to the CPAP then he will get more restful sleep at night.  In addition, he is having some day night reversal.   Subjective:   Discussed the use of AI scribe software for clinical note transcription with the patient, who gave  verbal consent to proceed.  History of Present Illness     Stuart Lucero was seen today in follow up for Parkinsons disease.  My previous records were reviewed prior to todays visit as well as outside records available to me. Pt was started on pramipexole  last visit, but ultimately experienced weight gain, compulsive eating and swelling and emailed us  and we weaned him off of this last month.  He reports that ***.  I did send the patient a referral for counseling and I can see that they called him but were not able to reach him.  They did send him a MyChart message, but it does not appear that the patient never read the message.  Current prescribed movement disorder medications: ***Carbidopa /levodopa  25/100, 2 tablets at 7 AM/11 AM/4 PM   PREVIOUS MEDICATIONS: {Parkinson's RX:18200} pramipexole   ALLERGIES:  Allergies  Allergen Reactions   Oxycodone-Acetaminophen Shortness Of Breath   Gabapentin Other (See Comments)    Makes me angry    CURRENT MEDICATIONS:  No outpatient medications have been marked as taking for the 11/30/24 encounter (Appointment) with Stephine Langbehn, [place] RAMAN, DO.     Objective:   PHYSICAL EXAMINATION:    VITALS:  There were no vitals filed for this visit.  GEN:  The patient appears stated age and is in NAD. HEENT:  Normocephalic, atraumatic.  The mucous membranes are moist. The superficial temporal arteries are without ropiness or tenderness. CV:  RRR Lungs:  CTAB Neck/HEME:  There are no carotid bruits bilaterally.  Neurological examination:  Orientation: The patient is alert and oriented x3. Cranial nerves: There is good facial symmetry with*** facial hypomimia. The speech is fluent and clear. Soft palate rises symmetrically and there is no tongue deviation. Hearing is intact to conversational tone. Sensation: Sensation is intact to light touch throughout Motor: Strength is at least antigravity x4.  Movement examination: Tone: There is mod increased tone  in the RUE/LUE Abnormal movements: there is mild bilateral UE rest tremor, L>R Coordination:  There is mod to severe decremation with RAM's, with any form of RAMS, including alternating supination and pronation of the forearm, hand opening and closing, finger taps, heel taps and toe taps bilaterally Gait and Station: The patient pushes off to arise.   The patient's stride length is short/decreased.  He has RUE tremor with ambulation and decreased arm swing bilaterally.      Total time spent on today's visit was ***30 minutes, including both face-to-face time and nonface-to-face time.  Time included that spent on review of records (prior notes available to me/labs/imaging if pertinent), discussing treatment and goals, answering patient's questions and coordinating care.  Cc:  Street, [name], MD

## 2024-11-30 ENCOUNTER — Encounter: Payer: Self-pay | Admitting: Neurology

## 2024-11-30 ENCOUNTER — Ambulatory Visit: Admitting: Neurology

## 2024-12-04 ENCOUNTER — Telehealth: Payer: Self-pay | Admitting: Neurology

## 2024-12-04 NOTE — Telephone Encounter (Signed)
 Called patient back he is having severe cramps and RLS at night. He did tell me that Dr. Evonnie had weaned him off the pramipexole  due to compulsive eating. He has started taking .25 of the pramipexole  at night before bed. He says that this really helps hi mand wanted to know if Dr. Evonnie approved and if you do if you would send in a RX for just .25 at bedtime to help with sleeping. He missed his appointment on Friday he said he thought weather was going to be worse than is was. He is now scheduled for April

## 2024-12-04 NOTE — Telephone Encounter (Signed)
 Pt called in this afternoon, and he wants a call back to discuss his prescriptions. Thanks

## 2024-12-07 NOTE — Telephone Encounter (Signed)
 Dr. Evonnie did get back to me and it is difficult but until she is able to see you she is unable to change anything at this time. It is a long wait. Unfortunately, our schedule is quite busy and we are doing our best to accommodate you . You could reach out to your primary care physician and see if they are able to help you in the meantime just until we can get you into the office

## 2024-12-18 ENCOUNTER — Telehealth: Payer: Self-pay | Admitting: Internal Medicine

## 2024-12-18 ENCOUNTER — Other Ambulatory Visit: Payer: Self-pay

## 2024-12-18 DIAGNOSIS — G20A1 Parkinson's disease without dyskinesia, without mention of fluctuations: Secondary | ICD-10-CM

## 2024-12-18 DIAGNOSIS — G2581 Restless legs syndrome: Secondary | ICD-10-CM

## 2024-12-18 MED ORDER — CARBIDOPA-LEVODOPA ER 50-200 MG PO TBCR: 1.0000 | EXTENDED_RELEASE_TABLET | Freq: Every day | ORAL | 0 refills | Status: AC

## 2024-12-18 MED ORDER — CARBIDOPA-LEVODOPA 25-100 MG PO TABS: 2.0000 | ORAL_TABLET | Freq: Three times a day (TID) | ORAL | 0 refills | Status: AC

## 2024-12-18 NOTE — Telephone Encounter (Signed)
 error

## 2024-12-18 NOTE — Telephone Encounter (Signed)
 Called patient and explained cl 50/200 cr and what it is used for patient appreciative of the new med and will let us  know how he is doing

## 2024-12-18 NOTE — Telephone Encounter (Signed)
 Pt called and LM with AN. Caller states he is coming off his medication for parkinson's but his legs are twitching. He needs to know what to take for the insomnia and twitching. He needs a refill on one of his medications ( did not specify which one )  Has had insomnia for a week or two now. Would like a call back or can message through mychart

## 2025-01-15 ENCOUNTER — Telehealth: Payer: Self-pay | Admitting: Neurology

## 2025-01-15 NOTE — Telephone Encounter (Signed)
 Pt called in this afternoon and  he stated that the prescription called: carbidopa -levodopa  (SINEMET  CR) 50-200 MG tablet , was working , and it helped him sleep. Pt stated that prescription is no longer working. Pt stated that he is still not sleeping, and he has restless legs as well. Thanks

## 2025-01-31 NOTE — Telephone Encounter (Signed)
 Just noting pt called 01/30/25 for a work in appt on 2/5.  Pt sent my chart message as well.  He responded to the message at 1:52 AM on 01/31/25 that he wanted the appt and asked the time.  I responded to that message at 7:30 am and it was read by him at 7:40am that he would need to be at appt at 9am.  Pt declined appt

## 2025-04-16 ENCOUNTER — Ambulatory Visit: Admitting: Neurology
